# Patient Record
Sex: Female | Born: 1990
Health system: Southern US, Community
[De-identification: ages and names within clinical notes are randomized; demographics above are authoritative.]

## PROBLEM LIST (undated history)

## (undated) DIAGNOSIS — R87810 Cervical high risk human papillomavirus (HPV) DNA test positive: Secondary | ICD-10-CM

## (undated) DIAGNOSIS — B009 Herpesviral infection, unspecified: Secondary | ICD-10-CM

## (undated) DIAGNOSIS — Z23 Encounter for immunization: Secondary | ICD-10-CM

## (undated) DIAGNOSIS — R87619 Unspecified abnormal cytological findings in specimens from cervix uteri: Secondary | ICD-10-CM

## (undated) HISTORY — DX: Unspecified abnormal cytological findings in specimens from cervix uteri: R87.619

## (undated) HISTORY — DX: Encounter for immunization: Z23

## (undated) HISTORY — DX: Herpesviral infection, unspecified: B00.9

## (undated) HISTORY — DX: Cervical high risk human papillomavirus (HPV) DNA test positive: R87.810

## (undated) HISTORY — PX: WISDOM TOOTH EXTRACTION: SHX21

---
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
## 2009-02-12 HISTORY — PX: DILATION AND CURETTAGE OF UTERUS: SHX78

## 2009-08-02 ENCOUNTER — Ambulatory Visit: Payer: Self-pay

## 2009-08-04 ENCOUNTER — Ambulatory Visit: Payer: Self-pay

## 2009-09-25 ENCOUNTER — Emergency Department (HOSPITAL_COMMUNITY)
Admission: EM | Admit: 2009-09-25 | Discharge: 2009-09-25 | Payer: Self-pay | Source: Home / Self Care | Admitting: Emergency Medicine

## 2010-10-14 DIAGNOSIS — R87619 Unspecified abnormal cytological findings in specimens from cervix uteri: Secondary | ICD-10-CM

## 2010-10-14 HISTORY — DX: Unspecified abnormal cytological findings in specimens from cervix uteri: R87.619

## 2012-03-01 ENCOUNTER — Observation Stay: Payer: Self-pay

## 2012-03-01 LAB — URINALYSIS, COMPLETE
Ketone: NEGATIVE
Leukocyte Esterase: NEGATIVE
Protein: NEGATIVE
Specific Gravity: 1.002 (ref 1.003–1.030)
WBC UR: <1 /HPF (ref 0–5)

## 2012-03-01 LAB — CBC WITH DIFFERENTIAL/PLATELET
Eosinophil #: 0 10*3/uL (ref 0.0–0.7)
Eosinophil %: 0.6 %
MCV: 85 fL (ref 80–100)
Monocyte %: 6.5 %
Neutrophil #: 4.9 10*3/uL (ref 1.4–6.5)
Platelet: 215 10*3/uL (ref 150–440)
RBC: 3.5 10*6/uL — ABNORMAL LOW (ref 3.80–5.20)
RDW: 13.1 % (ref 11.5–14.5)
WBC: 6.7 10*3/uL (ref 3.6–11.0)

## 2012-03-02 LAB — URINE CULTURE

## 2012-07-03 ENCOUNTER — Observation Stay: Payer: Self-pay | Admitting: Obstetrics and Gynecology

## 2012-07-05 ENCOUNTER — Observation Stay: Payer: Self-pay

## 2012-07-07 ENCOUNTER — Inpatient Hospital Stay: Payer: Self-pay

## 2012-07-07 LAB — CBC WITH DIFFERENTIAL/PLATELET
Basophil #: 0 10*3/uL (ref 0.0–0.1)
MCH: 24 pg — ABNORMAL LOW (ref 26.0–34.0)
MCV: 72 fL — ABNORMAL LOW (ref 80–100)
Monocyte #: 0.5 x10 3/mm (ref 0.2–0.9)
Neutrophil #: 5.5 10*3/uL (ref 1.4–6.5)
RBC: 3.83 10*6/uL (ref 3.80–5.20)

## 2012-07-08 LAB — HEMATOCRIT: HCT: 19.5 % — ABNORMAL LOW (ref 35.0–47.0)

## 2012-07-09 LAB — CBC WITH DIFFERENTIAL/PLATELET
Basophil #: 0 10*3/uL (ref 0.0–0.1)
HCT: 20.1 % — ABNORMAL LOW (ref 35.0–47.0)
HGB: 6.7 g/dL — ABNORMAL LOW (ref 12.0–16.0)
Lymphocyte %: 22.5 %
MCHC: 33.4 g/dL (ref 32.0–36.0)
MCV: 72 fL — ABNORMAL LOW (ref 80–100)
Monocyte #: 0.4 x10 3/mm (ref 0.2–0.9)
Neutrophil %: 67.9 %
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
## 2013-10-07 ENCOUNTER — Ambulatory Visit: Payer: Self-pay | Admitting: General Surgery

## 2013-10-08 ENCOUNTER — Encounter: Payer: Self-pay | Admitting: *Deleted

## 2014-01-12 DIAGNOSIS — B009 Herpesviral infection, unspecified: Secondary | ICD-10-CM

## 2014-01-12 HISTORY — DX: Herpesviral infection, unspecified: B00.9

## 2014-06-22 NOTE — H&P (Signed)
L&D Evaluation:  History Expanded:   HPI 24 yo G2P0010 whose EDC = may w26 (21 weeks), [facility].  Pt presents with one day of RLQ and LMQ pain which she characterizes as a sharp. intmittent, non-radiating pain, 5-6 opn a scale of 10.  Pt denies GI or GU Sx.  Pt denies VB and LOF    Blood Type (Maternal) B negative    Maternal HIV Negative    Maternal Syphilis Ab Nonreactive    Maternal Varicella Immune    Rubella Results (Maternal) immune    [facility]    Presents with abdominal pain    Patient's Medical History No Chronic Illness    Patient's Surgical History D&C    Medications Pre Natal Vitamins    Allergies NKDA    Social History none   ROS:   ROS All systems were reviewed.  HEENT, CNS, GI, GU, Respiratory, CV, Renal and Musculoskeletal systems were found to be normal.   Exam:   Vital Signs stable    General no apparent distress    Mental Status clear    Chest clear    Heart normal sinus rhythm    Abdomen gravid, non-tender    Estimated Fetal Weight Average for gestational age    Back no CVAT    Pelvic cervix closed and thick    Mebranes Intact    FHT normal rate with no decels    Other UA clear   Impression:   Impression Abdominal pain, probably physiologic   Plan:   Plan will allow to go home, restrict her work load, RTL+D with any increase in pain, etc   Electronic Signatures: Towana Badgerosenow, Denilson Salminen J (MD)  (Signed 18-Jan-14 14:55)  Authored: L&D Evaluation   Last Updated: 18-Jan-14 14:55 by Towana Badgerosenow, Olanda Downie J (MD)

## 2014-06-22 NOTE — H&P (Signed)
L&D Evaluation:  History Expanded:  HPI 24 yo G2P0010 whose EDC 07/07/12 per early US, presents at 40 weeks with contractions. No LOF or VB. [facility]   Blood Type (Maternal) B negative   Group B Strep Results Maternal (Result >5wks must be treated as unknown) negative   Maternal HIV Negative   Maternal Syphilis Ab Nonreactive   Maternal Varicella Immune   Rubella Results (Maternal) immune   EDC 07-Jul-2012   Presents with leaking fluid   Patient's Medical History No Chronic Illness   Patient's Surgical History D&C  wisdom teeth   Medications Pre Natal Vitamins   Allergies NKDA   Social History none   ROS:  ROS All systems were reviewed.  HEENT, CNS, GI, GU, Respiratory, CV, Renal and Musculoskeletal systems were found to be normal.   Exam:  Vital Signs stable   General no apparent distress   Mental Status clear   Chest clear   Heart no murmur/gallop/rubs   Abdomen gravid   Estimated Fetal Weight Average for gestational age   Back no CVAT   Pelvic cervix closed and thick, 3 cm on admission, now 6/100/-1   Mebranes Intact   FHT normal rate with no decels   Ucx regular, q 2-5 min   Impression:  Impression active labor   Plan:  Comments Anticipate delivery Comfortable after epidural now AROM with scant bloody fluid obtained   Electronic Signatures: Vella KohlerBrothers, Emmaleah Meroney K (CNM)  (Signed 26-May-14 08:20)  Authored: L&D Evaluation   Last Updated: 26-May-14 08:20 by Vella KohlerBrothers, Lateia Fraser K (CNM)

## 2014-06-22 NOTE — H&P (Signed)
L&D Evaluation:  History Expanded:  HPI 24 yo G2P0010 whose EDC 07/07/12 per early US, presents at 6739 5/7 weeks with c/o feeling leaking and having wetness on her pantyliner this am. Also noted some bleeding this am. +FM, occasional contractions. [facility].   Blood Type (Maternal) B negative   Maternal HIV Negative   Maternal Syphilis Ab Nonreactive   Maternal Varicella Immune   Rubella Results (Maternal) immune   [facility]   Presents with leaking fluid   Patient's Medical History No Chronic Illness   Patient's Surgical History D&C   Medications Pre Natal Vitamins   Allergies NKDA   Social History none   ROS:  ROS All systems were reviewed.  HEENT, CNS, GI, GU, Respiratory, CV, Renal and Musculoskeletal systems were found to be normal.   Exam:  Vital Signs stable   General no apparent distress   Mental Status clear   Abdomen gravid, non-tender   Estimated Fetal Weight Average for gestational age   Back no CVAT   Pelvic cervix closed and thick, SSE: negative nitrizine, negative fern, some clear mucousy d/c in vagina, wet mount: +yeast   Mebranes Intact   FHT normal rate with no decels   Ucx irregular   Impression:  Impression intact membranes   Plan:  Plan discharge   Comments Labor precautions Diflucan x 1 po   Follow Up Appointment already scheduled   Electronic Signatures: Vella KohlerBrothers, Louanne Calvillo K (CNM)  (Signed 24-May-14 16:25)  Authored: L&D Evaluation   Last Updated: 24-May-14 16:25 by Vella KohlerBrothers, Nikiah Goin K (CNM)

## 2015-01-17 ENCOUNTER — Ambulatory Visit
Admission: EM | Admit: 2015-01-17 | Discharge: 2015-01-17 | Disposition: A | Discharge status: Home / Self Care | Payer: 59 | Attending: Family Medicine | Admitting: Family Medicine

## 2015-01-17 DIAGNOSIS — J0100 Acute maxillary sinusitis, unspecified: Secondary | ICD-10-CM | POA: Diagnosis not present

## 2015-01-17 DIAGNOSIS — H6502 Acute serous otitis media, left ear: Secondary | ICD-10-CM | POA: Diagnosis not present

## 2015-01-17 LAB — RAPID STREP SCREEN (MED CTR MEBANE ONLY): STREPTOCOCCUS, GROUP A SCREEN (DIRECT): NEGATIVE

## 2015-01-17 MED ORDER — FLUTICASONE PROPIONATE 50 MCG/ACT NA SUSP: 2.0000 | Freq: Every day | NASAL | Status: DC

## 2015-01-17 MED ORDER — AMOXICILLIN-POT CLAVULANATE 875-125 MG PO TABS
1.0000 | ORAL_TABLET | Freq: Two times a day (BID) | ORAL | Status: DC
Start: 2015-01-17 — End: 2016-05-23

## 2015-01-17 MED ORDER — FEXOFENADINE-PSEUDOEPHED ER 180-240 MG PO TB24: 1.0000 | ORAL_TABLET | Freq: Every day | ORAL | Status: DC

## 2015-01-17 NOTE — ED Notes (Signed)
Started last Monday with runny nose, cough and sore throat. Not improving and also states has right ear pain

## 2015-01-17 NOTE — ED Provider Notes (Addendum)
CSN: [id number]     Arrival date & time 01/17/15  1007 History   First MD Initiated Contact with Patient 01/17/15 1218    Nurses notes were reviewed. Chief Complaint  Patient presents with  . URI    Patient's here because of URI symptoms for over a week. Started last week Monday or 8 days ago. Sore throat has progressively gotten somewhat better still there but most important thing is that the nasal congestion is worse she started having pain 2 days: The right ear in the right is been painful left is full but not really a lot of pain going to her. States that she is having trouble blowing anything from her nostrils and that she is coughing more as postnasal drainage going down the back of her throat.   (Consider location/radiation/quality/duration/timing/severity/associated sxs/prior Treatment) HPI  History reviewed. No pertinent past medical history. History reviewed. No pertinent past surgical history. History reviewed. No pertinent family history. Social History  Substance Use Topics  . Smoking status: Never Smoker   . Smokeless tobacco: None  . Alcohol Use: Yes     Comment: socially   OB History    No data available     Review of Systems  HENT: Positive for congestion, ear pain and facial swelling.   Eyes: Negative for discharge.  Respiratory: Positive for cough.   Cardiovascular: Negative for chest pain.  Skin: Negative for color change, pallor and rash.  Allergic/Immunologic: Negative for environmental allergies.  All other systems reviewed and are negative.   Allergies  Review of patient's allergies indicates no known allergies.  Home Medications   Prior to Admission medications   Medication Sig Start Date End Date Taking? Authorizing Provider  amoxicillin-clavulanate (AUGMENTIN) 875-125 MG tablet Take 1 tablet by mouth 2 (two) times daily. 01/17/15   Hassan Rowan, MD  fexofenadine-pseudoephedrine (ALLEGRA-D ALLERGY & CONGESTION) 180-240 MG 24 hr tablet Take 1  tablet by mouth daily. 01/17/15   Hassan Rowan, MD  fluticasone (FLONASE) 50 MCG/ACT nasal spray Place 2 sprays into both nostrils daily. 01/17/15   Hassan Rowan, MD  levonorgestrel (MIRENA) 20 MCG/24HR IUD 1 each by Intrauterine route once.    Historical Provider, MD   Meds Ordered and Administered this Visit  Medications - No data to display  BP 93/53 mmHg  Pulse 55  Temp(Src) 98.2 F (36.8 C) (Tympanic)  Resp 16  Ht  (1.702 m)  Wt 160 lb (72.576 kg)  BMI 25.05 kg/m2  SpO2 100% No data found.   Physical Exam  Constitutional: She is oriented to person, place, and time. She appears well-developed.  HENT:  Head: Normocephalic and atraumatic.  Right Ear: External ear and ear canal normal. Tympanic membrane is bulging. Decreased hearing is noted.  Left Ear: External ear and ear canal normal. Tympanic membrane is erythematous. Decreased hearing is noted.  Mouth/Throat: No dental abscesses or dental caries. No posterior oropharyngeal edema or posterior oropharyngeal erythema.  Eyes: Pupils are equal, round, and reactive to light.  Neck: Normal range of motion. Neck supple.  Cardiovascular: Normal rate, regular rhythm and normal heart sounds.   Pulmonary/Chest: Effort normal and breath sounds normal. No respiratory distress.  Musculoskeletal: Normal range of motion. She exhibits no edema or tenderness.  Neurological: She is alert and oriented to person, place, and time. No cranial nerve deficit.  Skin: Skin is warm and dry. No erythema.  Psychiatric: She has a normal mood and affect. Her behavior is normal.  Vitals reviewed.   ED  Course  Procedures (including critical care time)  Labs Review Labs Reviewed  RAPID STREP SCREEN (NOT AT [facility])  CULTURE, GROUP A STREP ([facility] ONLY)    Imaging Review No results found.   Visual Acuity Review  Right Eye Distance:   Left Eye Distance:   Bilateral Distance:    Right Eye Near:   Left Eye Near:    Bilateral Near:        Results for orders placed or performed during the hospital encounter of 01/17/15  Rapid strep screen  Result Value Ref Range   Streptococcus, Group A Screen (Direct) NEGATIVE NEGATIVE    MDM   1. Acute maxillary sinusitis, recurrence not specified   2. Acute serous otitis media of left ear, recurrence not specified    We'll place patient on Augmentin 875 one tablet twice a day Flonase nasal spray 2 puffs each nostril daily sent to the pharmacy as well as Allegra-D 24 hours 1 tablet daily. Will give a work note for today and tomorrow follow-up PCP as needed.     Hassan RowanEugene Terie Lear, MD 01/17/15 [id number]  Hassan RowanEugene Tyon Cerasoli, MD 01/17/15 [phone]

## 2015-01-17 NOTE — Discharge Instructions (Signed)
Otitis Media, Adult °Otitis media is redness, soreness, and puffiness (swelling) in the space just behind your eardrum (middle ear). It may be caused by allergies or infection. It often happens along with a cold. °HOME CARE °· Take your medicine as told. Finish it even if you start to feel better. °· Only take over-the-counter or prescription medicines for pain, discomfort, or fever as told by your doctor. °· Follow up with your doctor as told. °GET HELP IF: °· You have otitis media only in one ear, or bleeding from your nose, or both. °· You notice a lump on your neck. °· You are not getting better in 3-5 days. °· You feel worse instead of better. °GET HELP RIGHT AWAY IF:  °· You have pain that is not helped with medicine. °· You have puffiness, redness, or pain around your ear. °· You get a stiff neck. °· You cannot move part of your face (paralysis). °· You notice that the bone behind your ear hurts when you touch it. °MAKE SURE YOU:  °· Understand these instructions. °· Will watch your condition. °· Will get help right away if you are not doing well or get worse. °  °This information is not intended to replace advice given to you by your health care provider. Make sure you discuss any questions you have with your health care provider. °  °Document Released: 07/18/2007 Document Revised: 02/19/2014 Document Reviewed: 08/26/2012 °Elsevier Interactive Patient Education ©2016 [employer]vier Inc[employer] ° °Sinusitis, Adult °Sinusitis is redness, soreness, and puffiness (inflammation) of the air pockets in the bones of your face (sinuses). The redness, soreness, and puffiness can cause air and mucus to get trapped in your sinuses. This can allow germs to grow and cause an infection.  °HOME CARE  °· Drink enough fluids to keep your pee (urine) clear or pale yellow. °· Use a humidifier in your home. °· Run a hot shower to create steam in the bathroom. Sit in the bathroom with the door closed. Breathe in the steam 3-4 times a  day. °· Put a warm, moist washcloth on your face 3-4 times a day, or as told by your doctor. °· Use salt water sprays (saline sprays) to wet the thick fluid in your nose. This can help the sinuses drain. °· Only take medicine as told by your doctor. °GET HELP RIGHT AWAY IF:  °· Your pain gets worse. °· You have very bad headaches. °· You are sick to your stomach (nauseous). °· You throw up (vomit). °· You are very sleepy (drowsy) all the time. °· Your face is puffy (swollen). °· Your vision changes. °· You have a stiff neck. °· You have trouble breathing. °MAKE SURE YOU:  °· Understand these instructions. °· Will watch your condition. °· Will get help right away if you are not doing well or get worse. °  °This information is not intended to replace advice given to you by your health care provider. Make sure you discuss any questions you have with your health care provider. °  °Document Released: 07/18/2007 Document Revised: 02/19/2014 Document Reviewed: 09/04/2011 °Elsevier Interactive Patient Education ©2016 Elsevier Inc. ° °

## 2015-01-20 LAB — CULTURE, GROUP A STREP (THRC)

## 2015-01-20 NOTE — ED Notes (Signed)
Final report of strep testing negative  

## 2016-04-02 DIAGNOSIS — Z113 Encounter for screening for infections with a predominantly sexual mode of transmission: Secondary | ICD-10-CM | POA: Diagnosis not present

## 2016-04-02 DIAGNOSIS — N760 Acute vaginitis: Secondary | ICD-10-CM | POA: Diagnosis not present

## 2016-04-02 DIAGNOSIS — A5901 Trichomonal vulvovaginitis: Secondary | ICD-10-CM | POA: Diagnosis not present

## 2016-05-07 ENCOUNTER — Other Ambulatory Visit: Payer: Self-pay | Admitting: Obstetrics and Gynecology

## 2016-05-10 ENCOUNTER — Telehealth: Payer: Self-pay

## 2016-05-10 NOTE — Telephone Encounter (Signed)
Pt calling for refill of metronidazole to [facility] [address]

## 2016-05-10 NOTE — Telephone Encounter (Signed)
RN to find out why she needs flagyl. Treated for trich 2/18. If sx again, needs rechk, not Rx RF, and did partner do testing? Thx.

## 2016-05-23 ENCOUNTER — Encounter: Payer: Self-pay | Admitting: Obstetrics and Gynecology

## 2016-05-23 ENCOUNTER — Ambulatory Visit (INDEPENDENT_AMBULATORY_CARE_PROVIDER_SITE_OTHER): Payer: BLUE CROSS/BLUE SHIELD | Admitting: Obstetrics and Gynecology

## 2016-05-23 VITALS — BP 110/70 | HR 54 | Ht 67.0 in | Wt 160.0 lb

## 2016-05-23 DIAGNOSIS — A599 Trichomoniasis, unspecified: Secondary | ICD-10-CM

## 2016-05-23 DIAGNOSIS — N898 Other specified noninflammatory disorders of vagina: Secondary | ICD-10-CM | POA: Diagnosis not present

## 2016-05-23 LAB — POCT WET PREP WITH KOH
Clue Cells Wet Prep HPF POC: NEGATIVE
KOH PREP POC: POSITIVE — AB
TRICHOMONAS UA: POSITIVE
YEAST WET PREP PER HPF POC: NEGATIVE

## 2016-05-23 MED ORDER — METRONIDAZOLE 500 MG PO TABS
500.0000 mg | ORAL_TABLET | Freq: Two times a day (BID) | ORAL | 0 refills | Status: AC
Start: 2016-05-23 — End: 2016-05-24

## 2016-05-23 NOTE — Progress Notes (Signed)
Chief Complaint  Patient presents with  . Vaginal Discharge     HPI:      Pamela Bishop is a 26 y.o. G2P1011 who LMP was No LMP recorded (lmp unknown). Patient is not currently having periods (Reason: IUD)., presents today for a problem visit.  Sex activity: single partner, contraception - IUD.   She complains of increased vaginal d/c, without irritation or odor for the past 2-3 wks. She was treated for trich 2/18. She states her partner was treated, too. She had neg gon/chlam testing 2/18. No fevers, LBP, belly pain.   Review of Systems  Constitutional: Negative for fever.  Gastrointestinal: Negative for blood in stool, constipation, diarrhea, nausea and vomiting.  Genitourinary: Positive for vaginal discharge. Negative for dyspareunia, dysuria, flank pain, frequency, hematuria, urgency, vaginal bleeding and vaginal pain.  Musculoskeletal: Negative for back pain.  Skin: Negative for rash.    Past Medical History:  Diagnosis Date  . Abnormal Pap smear of cervix 10/2010   lsil  . Herpes 01/2014   outbreak on cx; Pos HSV 2 IgG  . Immunization, viral disease    gardasil completed    History reviewed. No pertinent family history.  Social History   Social History  . Marital status: Single    Spouse name: N/A  . Number of children: N/A  . Years of education: N/A   Occupational History  . Not on file.   Social History Main Topics  . Smoking status: Never Smoker  . Smokeless tobacco: Never Used  . Alcohol use Yes     Comment: socially  . Drug use: No  . Sexual activity: Yes    Birth control/ protection: IUD   Other Topics Concern  . Not on file   Social History Narrative  . No narrative on file    OBJECTIVE:   Vitals:  BP 110/70 (Patient Position: Sitting)   Pulse (!) 54   Ht  (1.702 m)   Wt 160 lb (72.6 kg)   LMP  (LMP Unknown)   BMI 25.06 kg/m   Physical Exam  Constitutional: She is oriented to person, place, and time and well-developed,  well-nourished, and in no distress. Vital signs are normal.  Genitourinary: Uterus normal, cervix normal, right adnexa normal, left adnexa normal and vulva normal. Uterus is not enlarged. Cervix exhibits no motion tenderness and no tenderness. Right adnexum displays no mass and no tenderness. Left adnexum displays no mass and no tenderness. Vulva exhibits no erythema, no lesion, no rash and no tenderness. Vagina exhibits exudate. Vagina exhibits no lesion. Creamy  white and vaginal discharge found.  Neurological: She is oriented to person, place, and time.    Results: Results for orders placed or performed in visit on 05/23/16 (from the past 24 hour(s))  POCT Wet Prep with KOH     Status: Abnormal   Collection Time: 05/23/16  8:47 AM  Result Value Ref Range   Trichomonas, UA Positive    Clue Cells Wet Prep HPF POC neg    Epithelial Wet Prep HPF POC  Few, Moderate, Many, Too numerous to count   Yeast Wet Prep HPF POC neg    Bacteria Wet Prep HPF POC  Few   RBC Wet Prep HPF POC     WBC Wet Prep HPF POC     KOH Prep POC Positive (A) Negative     Assessment/Plan: Trichomonosis - Rx flagyl. Partner needs tx. Condoms. F/u prn. - Plan: metroNIDAZOLE (FLAGYL) 500 MG tablet,  POCT Wet Prep with KOH  Vaginal discharge - Plan: POCT Wet Prep with KOH     Return if symptoms worsen or fail to improve.  Alicia B. Copland, PA-C 05/23/2016 8:50 AM

## 2016-12-18 ENCOUNTER — Encounter: Payer: Self-pay | Admitting: Maternal Newborn

## 2016-12-18 ENCOUNTER — Ambulatory Visit (INDEPENDENT_AMBULATORY_CARE_PROVIDER_SITE_OTHER): Payer: BLUE CROSS/BLUE SHIELD | Admitting: Maternal Newborn

## 2016-12-18 VITALS — BP 80/60 | HR 54 | Ht 67.0 in | Wt 169.0 lb

## 2016-12-18 DIAGNOSIS — R319 Hematuria, unspecified: Secondary | ICD-10-CM

## 2016-12-18 DIAGNOSIS — R102 Pelvic and perineal pain: Secondary | ICD-10-CM | POA: Diagnosis not present

## 2016-12-18 DIAGNOSIS — R1030 Lower abdominal pain, unspecified: Secondary | ICD-10-CM | POA: Diagnosis not present

## 2016-12-18 DIAGNOSIS — B3731 Acute candidiasis of vulva and vagina: Secondary | ICD-10-CM

## 2016-12-18 DIAGNOSIS — B373 Candidiasis of vulva and vagina: Secondary | ICD-10-CM | POA: Diagnosis not present

## 2016-12-18 LAB — POCT WET PREP (WET MOUNT)
Clue Cells Wet Prep Whiff POC: NEGATIVE
TRICHOMONAS WET PREP HPF POC: ABSENT

## 2016-12-18 LAB — POCT URINALYSIS DIPSTICK
LEUKOCYTES UA: NEGATIVE
NITRITE UA: ABSENT

## 2016-12-18 LAB — URINALYSIS
Spec Grav, Fluid: 1.01
Urine, pH: 6

## 2016-12-18 MED ORDER — FLUCONAZOLE 150 MG PO TABS: 150.0000 mg | ORAL_TABLET | ORAL | 0 refills | Status: DC

## 2016-12-18 NOTE — Progress Notes (Signed)
Gynecology Pelvic Pain Evaluation   Chief Complaint:  Chief Complaint  Patient presents with  . Pelvic Pain  . Back Pain    History of Present Illness:   Patient is a 26 y.o. G2P101461,  No LMP recorded (lmp unknown). Patient is not currently having periods (Reason: IUD)., presents today for a problem visit.  She complains of discharge and pain.   Her pain is localized to the deep pelvis area, described as intermittent, began several weeks ago and its severity is described as moderate. The pain radiates to the  bilateral back. She has these associated symptoms which include vaginal discharge and occasional feeling of pelvic pressure. Patient has these modifiers which include nothing that makes it better and movement/standing that make it worse.  Context includes: spontaneous and at rest.  Patient notes some lower abdominal pain and no change in bowel habits.  Previous evaluation: none. Prior Diagnosis: none. Previous Treatment: none.  Review of Systems: Review of Systems  Constitutional: Negative.   HENT: Negative.   Eyes: Negative.   Respiratory: Negative.   Cardiovascular: Negative.   Gastrointestinal: Negative.   Genitourinary: Negative for dysuria, frequency and urgency.       Pelvic pain and discharge  Musculoskeletal: Positive for back pain.  Skin: Negative.   Neurological: Negative.   Endo/Heme/Allergies: Negative.   Psychiatric/Behavioral: Negative.   All other systems reviewed and are negative.   Past Medical History:  Past Medical History:  Diagnosis Date  . Abnormal Pap smear of cervix 10/2010   lsil  . Herpes 01/2014   outbreak on cx; Pos HSV 2 IgG  . Immunization, viral disease    gardasil completed    Past Surgical History:  Past Surgical History:  Procedure Laterality Date  . DILATION AND CURETTAGE OF UTERUS  2011   [facility]  . WISDOM TOOTH EXTRACTION      Gynecologic History:  No LMP recorded (lmp unknown). Patient is not currently having periods  (Reason: IUD). She is single partner, contraception - IUD.  Hx of STDs: trichomonas, HPV positive, herpes  Obstetric History: G2P1011  Family History:  History reviewed. No pertinent family history.  Social History:  Social History   Socioeconomic History  . Marital status: Single    Spouse name: Not on file  . Number of children: Not on file  . Years of education: Not on file  . Highest education level: Not on file  Social Needs  . Financial resource strain: Not on file  . Food insecurity - worry: Not on file  . Food insecurity - inability: Not on file  . Transportation needs - medical: Not on file  . Transportation needs - non-medical: Not on file  Occupational History  . Not on file  Tobacco Use  . Smoking status: Never Smoker  . Smokeless tobacco: Never Used  Substance and Sexual Activity  . Alcohol use: Yes    Comment: socially  . Drug use: No  . Sexual activity: Yes    Birth control/protection: IUD  Other Topics Concern  . Not on file  Social History Narrative  . Not on file    Allergies:  No Known Allergies  Medications: Prior to Admission medications   Medication Sig Start Date End Date Taking? Authorizing Provider  fluconazole (DIFLUCAN) 150 MG tablet Take 1 tablet (150 mg total) as directed for 1 dose by mouth. Can take additional dose three days later if symptoms persist 12/18/16   Oswaldo ConroySchmid, Brittley Regner Y, CNM  levonorgestrel (MIRENA) 20 MCG/24HR IUD  1 each by Intrauterine route once.    [provider]  valACYclovir (VALTREX) 500 MG tablet Take 500 mg by mouth 2 (two) times daily as needed. 03/21/16   [provider]    Physical Exam Vitals: Blood pressure (!) 80/60, pulse (!) 54, height 5\' 7"  (1.702 m), weight 169 lb (76.7 kg).  General: NAD HEENT: normocephalic, anicteric Pulmonary: No increased work of breathing Abdomen: NABS, soft, non-tender, non-distended.  Umbilicus without lesions.  No hepatomegaly, splenomegaly or masses  palpable. No evidence of hernia  Genitourinary:  External: External female genitalia appears irritated.  Normal urethral meatus, normal Bartholin's  and Skene's glands.    Vagina: Normal vaginal mucosa, no evidence of prolapse.    Cervix:  Does not appear reddened or friable, no bleeding, small amount of white discharge present.  IUD strings visible, small whitish appearing object or tissue visible at external os, did not appear to  be part of IUD and unable to remove with swab/gentle manipulation.   Uterus: Non-enlarged, mobile, normal contour.  No CMT. Tenderness to palpation in midline.  Adnexa: ovaries non-enlarged, no adnexal masses  Rectal: deferred  Lymphatic: no evidence of inguinal lymphadenopathy Extremities: no edema, erythema, or tenderness Neurologic: Grossly intact Psychiatric: mood appropriate, affect full  Hematuria found on UA today. Wet mount positive for yeast.  Assessment: 26 y.o. G2P1011 with discharge and pelvic pain radiating to her lower back on both sides..  Problem List Items Addressed This Visit    None    Visit Diagnoses    Pelvic pain    -  Primary   Relevant Orders   NuSwab Vaginitis Plus (VG+)   US PELVIS TRANSVANGINAL NON-OB (TV ONLY)   Lower abdominal pain       Relevant Orders   POCT urinalysis dipstick   Hematuria, unspecified type       Relevant Orders   Urine Culture   Vaginal yeast infection       Relevant Medications   fluconazole (DIFLUCAN) 150 MG tablet       We discussed some of the possible etiologies for pelvic pain in women.  Gynecologic causes may include endometriosis, adenomyosis, pelvic inflammatory disease (PID), ovarian cysts, ovarian or tubal torsion, and in rare case gynecologic malignancy such as cervical, uterine, or ovarian cancer.  In addition, there is a possibility of non-gynecologic etiologies such as urinary or GI tract pathology or disorders, as well as musculoskeletal problems.  The goal is to complete a basic work up  in hopes of identifying the underlying cause which in turn will dictate treatment.   - Transvaginal ultrasound ordered, patient prefers to defer to same date as annual exam scheduled on 01/01/2017. - NuSwab VG Plus to rule out STDs and yeast other than C. albicans. - UA ordered and reviwed, urine culture sent due to RBCs on UA and lumbar pain.  Follow up on 01/01/17 for ultrasound and annual, or sooner if symptoms worsen.  Marcelyn BruinsJacelyn Sherell Christoffel, CNM 12/18/2016  10:08 PM

## 2016-12-20 LAB — URINE CULTURE

## 2016-12-21 LAB — NUSWAB VAGINITIS PLUS (VG+)
ATOPOBIUM VAGINAE: HIGH {score} — AB
CANDIDA GLABRATA, NAA: NEGATIVE
Candida albicans, NAA: NEGATIVE
Chlamydia trachomatis, NAA: NEGATIVE
Megasphaera 1: HIGH Score — AB
NEISSERIA GONORRHOEAE, NAA: NEGATIVE
Trich vag by NAA: NEGATIVE

## 2016-12-24 ENCOUNTER — Other Ambulatory Visit: Payer: Self-pay | Admitting: Maternal Newborn

## 2016-12-24 DIAGNOSIS — B9689 Other specified bacterial agents as the cause of diseases classified elsewhere: Secondary | ICD-10-CM

## 2016-12-24 DIAGNOSIS — N760 Acute vaginitis: Principal | ICD-10-CM

## 2016-12-24 MED ORDER — METRONIDAZOLE 500 MG PO TABS: 500.0000 mg | ORAL_TABLET | Freq: Two times a day (BID) | ORAL | 0 refills | Status: AC

## 2016-12-24 NOTE — Progress Notes (Signed)
Rx for Flagyl sent to pharmacy for lab results indicating presence of bacterial vaginosis. Spoke with patient and she is aware and knows to avoid alcohol during tx.  Marcelyn BruinsJacelyn Tasha Jindra, CNM 12/24/2016  11:34 AM

## 2017-01-01 ENCOUNTER — Encounter: Payer: Self-pay | Admitting: Obstetrics and Gynecology

## 2017-01-01 ENCOUNTER — Ambulatory Visit (INDEPENDENT_AMBULATORY_CARE_PROVIDER_SITE_OTHER): Payer: BLUE CROSS/BLUE SHIELD | Admitting: Obstetrics and Gynecology

## 2017-01-01 ENCOUNTER — Ambulatory Visit (INDEPENDENT_AMBULATORY_CARE_PROVIDER_SITE_OTHER): Payer: BLUE CROSS/BLUE SHIELD

## 2017-01-01 VITALS — BP 100/60 | HR 48 | Ht 67.0 in | Wt 160.0 lb

## 2017-01-01 DIAGNOSIS — Z30431 Encounter for routine checking of intrauterine contraceptive device: Secondary | ICD-10-CM

## 2017-01-01 DIAGNOSIS — R1031 Right lower quadrant pain: Secondary | ICD-10-CM

## 2017-01-01 DIAGNOSIS — R109 Unspecified abdominal pain: Secondary | ICD-10-CM | POA: Diagnosis not present

## 2017-01-01 DIAGNOSIS — Z124 Encounter for screening for malignant neoplasm of cervix: Secondary | ICD-10-CM | POA: Diagnosis not present

## 2017-01-01 DIAGNOSIS — A6004 Herpesviral vulvovaginitis: Secondary | ICD-10-CM | POA: Diagnosis not present

## 2017-01-01 DIAGNOSIS — Z01419 Encounter for gynecological examination (general) (routine) without abnormal findings: Secondary | ICD-10-CM | POA: Diagnosis not present

## 2017-01-01 DIAGNOSIS — R102 Pelvic and perineal pain: Secondary | ICD-10-CM | POA: Diagnosis not present

## 2017-01-01 DIAGNOSIS — R87610 Atypical squamous cells of undetermined significance on cytologic smear of cervix (ASC-US): Secondary | ICD-10-CM | POA: Diagnosis not present

## 2017-01-01 NOTE — Progress Notes (Signed)
PCP:  Patient, No Pcp Per   Chief Complaint  Patient presents with  . Gynecologic Exam     HPI:      Ms. Pamela Bishop is a 26 y.o. G2P1011 who LMP was No LMP recorded (lmp unknown). Patient is not currently having periods (Reason: IUD)., presents today for her annual examination.  Her menses are absent due to the IUD. Dysmenorrhea mild, occurring throughout cycle. She does have occas intermenstrual bleeding.  Sex activity: single partner, contraception - IUD. Mirena placed 08/20/12. [facility]  Last Pap: July 25, 2015  Results were: no abnormalities /neg HPV DNA  Hx of STDs: HSV, HPV. No recent HSV outbreaks. Doesn't need Rx valtrex RF currently.   There is no FH of breast cancer. There is no FH of ovarian cancer. The patient does do self-breast exams.  Tobacco use: The patient denies current or previous tobacco use. Alcohol use: none No drug use.  Exercise: mod active  She does get adequate calcium and Vitamin D in her diet.  Pt's vaginitis sx from 12/18/16 have improved with tx.   Pt also with complaints of RT flank pain that radiates to RLQ over the past month. Pain is random and intermittent, and has caused RLQ to tighten. Sx last about a minute and then resolve. No urin sx, hx of kidney stones, hematuria. Pt does have some episodic constipation. Maryruth EveJaci Schmid ordered u/s when she saw her for sx 12/18/16 and it was done today.   Past Medical History:  Diagnosis Date  . Abnormal Pap smear of cervix 10/2010   lsil  . Herpes 01/2014   outbreak on cx; Pos HSV 2 IgG  . Immunization, viral disease    gardasil completed    Past Surgical History:  Procedure Laterality Date  . DILATION AND CURETTAGE OF UTERUS  2011   [facility]  . WISDOM TOOTH EXTRACTION      History reviewed. No pertinent family history.  Social History   Socioeconomic History  . Marital status: Single    Spouse name: Not on file  . Number of children: Not on file  . Years of  education: Not on file  . Highest education level: Not on file  Social Needs  . Financial resource strain: Not on file  . Food insecurity - worry: Not on file  . Food insecurity - inability: Not on file  . Transportation needs - medical: Not on file  . Transportation needs - non-medical: Not on file  Occupational History  . Not on file  Tobacco Use  . Smoking status: Never Smoker  . Smokeless tobacco: Never Used  Substance and Sexual Activity  . Alcohol use: Yes    Comment: socially  . Drug use: No  . Sexual activity: Yes    Birth control/protection: IUD  Other Topics Concern  . Not on file  Social History Narrative  . Not on file    Current Meds  Medication Sig  . levonorgestrel (MIRENA) 20 MCG/24HR IUD 1 each by Intrauterine route once.  . valACYclovir (VALTREX) 500 MG tablet Take 500 mg by mouth 2 (two) times daily as needed.     ROS:  Review of Systems  Constitutional: Negative for fatigue, fever and unexpected weight change.  Respiratory: Negative for cough, shortness of breath and wheezing.   Cardiovascular: Negative for chest pain, palpitations and leg swelling.  Gastrointestinal: Positive for constipation. Negative for blood in stool, diarrhea, nausea and vomiting.  Endocrine: Negative  for cold intolerance, heat intolerance and polyuria.  Genitourinary: Positive for flank pain, pelvic pain and vaginal discharge. Negative for dyspareunia, dysuria, frequency, genital sores, hematuria, menstrual problem, urgency, vaginal bleeding and vaginal pain.  Musculoskeletal: Negative for back pain, joint swelling and myalgias.  Skin: Negative for rash.  Neurological: Positive for headaches. Negative for dizziness, syncope, light-headedness and numbness.  Hematological: Negative for adenopathy.  Psychiatric/Behavioral: Negative for agitation, confusion, sleep disturbance and suicidal ideas. The patient is not nervous/anxious.      Objective: BP 100/60   Pulse (!) 48   Ht  5\' 7"  (1.702 m)   Wt 160 lb (72.6 kg)   LMP  (LMP Unknown) Comment: iud  BMI 25.06 kg/m    Physical Exam  Constitutional: She is oriented to person, place, and time. She appears well-developed and well-nourished.  Genitourinary: Vagina normal. There is no rash or tenderness on the right labia. There is no rash or tenderness on the left labia. No erythema or tenderness in the vagina. No vaginal discharge found. Right adnexum does not display mass and does not display tenderness. Left adnexum does not display mass and does not display tenderness.  Cervix exhibits visible IUD strings. Cervix does not exhibit motion tenderness or polyp.   Uterus is tender. Uterus is not enlarged.  Neck: Normal range of motion. No thyromegaly present.  Cardiovascular: Normal rate, regular rhythm and normal heart sounds.  No murmur heard. Pulmonary/Chest: Effort normal and breath sounds normal. Right breast exhibits no mass, no nipple discharge, no skin change and no tenderness. Left breast exhibits no mass, no nipple discharge, no skin change and no tenderness.  Abdominal: Soft. There is no tenderness. There is no guarding.  Musculoskeletal: Normal range of motion.  Neurological: She is alert and oriented to person, place, and time. No cranial nerve deficit.  Psychiatric: She has a normal mood and affect. Her behavior is normal.  Vitals reviewed.   Results: GYN U/S-->   Assessment/Plan: Encounter for annual routine gynecological examination  Cervical cancer screening - Plan: IGP, rfx Aptima HPV ASCU  Encounter for routine checking of intrauterine contraceptive device (IUD) - IUD in place. Due for removal 7/19. Pt will [facility] next spring [facility].  Right flank pain  RLQ abdominal pain - Neg GYN u/s. IUD in place. Question related to constipation vs kidney stones. Pt to follow expectantly.   Herpes simplex vulvovaginitis - Will call for valtrex RF prn.  GYN counsel adequate intake of  calcium and vitamin D, diet and exercise     F/U  Return in about 1 year (around 01/01/2018).  Riyanshi Wahab B. Terral Cooks, PA-C 01/01/2017 11:35 AM

## 2017-01-01 NOTE — Patient Instructions (Signed)
I value your feedback and entrusting us with your care. If you get a [facility] patient survey, I would appreciate you taking the time to let us know about your experience today. Thank you! 

## 2017-01-06 LAB — IGP, RFX APTIMA HPV ASCU: PAP Smear Comment: 0

## 2017-01-06 LAB — HPV APTIMA: HPV Aptima: POSITIVE — AB

## 2017-01-07 ENCOUNTER — Telehealth: Payer: Self-pay | Admitting: Obstetrics and Gynecology

## 2017-01-07 ENCOUNTER — Encounter: Payer: Self-pay | Admitting: Obstetrics and Gynecology

## 2017-01-07 NOTE — Telephone Encounter (Signed)
Pt is calling due to missed call please advise °

## 2017-01-07 NOTE — Telephone Encounter (Signed)
Schedule 01/18/17 with Dr. Jerene PitchSchuman

## 2017-01-07 NOTE — Telephone Encounter (Signed)
Pls call pt back to sched colpo with any MD, not urgent. She is aware.

## 2017-01-18 ENCOUNTER — Encounter: Payer: Self-pay | Admitting: Obstetrics and Gynecology

## 2017-01-18 ENCOUNTER — Ambulatory Visit (INDEPENDENT_AMBULATORY_CARE_PROVIDER_SITE_OTHER): Payer: BLUE CROSS/BLUE SHIELD | Admitting: Obstetrics and Gynecology

## 2017-01-18 VITALS — BP 100/60 | HR 53 | Ht 67.0 in | Wt 165.0 lb

## 2017-01-18 DIAGNOSIS — R87810 Cervical high risk human papillomavirus (HPV) DNA test positive: Secondary | ICD-10-CM | POA: Diagnosis not present

## 2017-01-18 DIAGNOSIS — R87610 Atypical squamous cells of undetermined significance on cytologic smear of cervix (ASC-US): Secondary | ICD-10-CM

## 2017-01-24 ENCOUNTER — Ambulatory Visit: Payer: BLUE CROSS/BLUE SHIELD | Admitting: Obstetrics and Gynecology

## 2017-02-07 DIAGNOSIS — L2089 Other atopic dermatitis: Secondary | ICD-10-CM | POA: Diagnosis not present

## 2017-02-12 HISTORY — PX: IUD REMOVAL: SHX5392

## 2017-02-13 ENCOUNTER — Ambulatory Visit: Payer: BLUE CROSS/BLUE SHIELD | Admitting: Obstetrics and Gynecology

## 2017-02-27 ENCOUNTER — Ambulatory Visit: Payer: BLUE CROSS/BLUE SHIELD | Admitting: Obstetrics and Gynecology

## 2017-03-04 ENCOUNTER — Encounter: Payer: Self-pay | Admitting: Obstetrics and Gynecology

## 2017-03-04 ENCOUNTER — Ambulatory Visit (INDEPENDENT_AMBULATORY_CARE_PROVIDER_SITE_OTHER): Payer: BLUE CROSS/BLUE SHIELD | Admitting: Obstetrics and Gynecology

## 2017-03-04 ENCOUNTER — Other Ambulatory Visit: Payer: Self-pay | Admitting: Obstetrics and Gynecology

## 2017-03-04 ENCOUNTER — Telehealth: Payer: Self-pay | Admitting: Obstetrics and Gynecology

## 2017-03-04 VITALS — BP 104/60 | HR 62 | Ht 67.0 in | Wt 162.0 lb

## 2017-03-04 DIAGNOSIS — Z30011 Encounter for initial prescription of contraceptive pills: Secondary | ICD-10-CM

## 2017-03-04 DIAGNOSIS — N870 Mild cervical dysplasia: Secondary | ICD-10-CM | POA: Diagnosis not present

## 2017-03-04 DIAGNOSIS — N871 Moderate cervical dysplasia: Secondary | ICD-10-CM | POA: Diagnosis not present

## 2017-03-04 DIAGNOSIS — R87610 Atypical squamous cells of undetermined significance on cytologic smear of cervix (ASC-US): Secondary | ICD-10-CM | POA: Diagnosis not present

## 2017-03-04 DIAGNOSIS — Z30432 Encounter for removal of intrauterine contraceptive device: Secondary | ICD-10-CM

## 2017-03-04 DIAGNOSIS — R87810 Cervical high risk human papillomavirus (HPV) DNA test positive: Secondary | ICD-10-CM

## 2017-03-04 DIAGNOSIS — B977 Papillomavirus as the cause of diseases classified elsewhere: Secondary | ICD-10-CM | POA: Diagnosis not present

## 2017-03-04 DIAGNOSIS — N72 Inflammatory disease of cervix uteri: Secondary | ICD-10-CM | POA: Diagnosis not present

## 2017-03-04 MED ORDER — DESOGESTREL-ETHINYL ESTRADIOL 0.15-30 MG-MCG PO TABS: 1.0000 | ORAL_TABLET | Freq: Every day | ORAL | 11 refills | Status: DC

## 2017-03-04 NOTE — Telephone Encounter (Signed)
Pt is calling to [facility] [address] [place], [place].please advise

## 2017-03-04 NOTE — Telephone Encounter (Signed)
Okay, resent prescription.

## 2017-03-04 NOTE — Progress Notes (Signed)
   History of Present Illness:  Pamela Bishop is a 27 y.o. that had a Mirena IUD placed approximately 5 years ago. Since that time, she states that she has had problems with recurrent yeast infections.  27 y.o. G2P1011 here for colposcopy for ASCUS with POSITIVE high risk HPV pap smear on 01/01/2017 . Discussed underlying role for HPV infection in the development of cervical dysplasia, its natural history and progression/regression, need for surveillance.  Is the patient  pregnant: Yes LMP: No LMP recorded (approximate). Patient is not currently having periods (Reason: IUD). Smoking status: Never smoker Number of lifetime sexual partners:  Less than 10 Future fertility desired:  Yes  Patient given informed consent, signed copy in the chart, time out was performed.  The patient was position in dorsal lithotomy position. Speculum was placed the cervix was visualized.   After application of acetic acid colposcopic inspection of the cervix was undertaken.   Patient Active Problem List   Diagnosis Date Noted  . Right flank pain 01/01/2017   Medications:  Current Outpatient Medications on File Prior to Visit  Medication Sig Dispense Refill  . fluconazole (DIFLUCAN) 150 MG tablet Take 1 tablet (150 mg total) as directed for 1 dose by mouth. Can take additional dose three days later if symptoms persist 2 tablet 0  . levonorgestrel (MIRENA) 20 MCG/24HR IUD 1 each by Intrauterine route once.    . valACYclovir (VALTREX) 500 MG tablet Take 500 mg by mouth 2 (two) times daily as needed.  1   No current facility-administered medications on file prior to visit.    Allergies: has No Known Allergies.  Physical Exam:  BP 104/60 (BP Location: Left Arm, Patient Position: Sitting, Cuff Size: Normal)   Pulse 62   Ht 5\' 7"  (1.702 m)   Wt 162 lb (73.5 kg)   LMP  (Approximate)   BMI 25.37 kg/m  Body mass index is 25.37 kg/m. Constitutional: Well nourished, well developed female in no acute distress.    Abdomen: diffusely non tender to palpation, non distended, and no masses, hernias Neuro: Grossly intact Psych:  Normal mood and affect.   Pelvic exam:  Two IUD strings present seen coming from the cervical os. EGBUS, vaginal vault and cervix: within normal limits  GYNECOLOGY CLINIC COLPOSCOPY PROCEDURE NOTE AND IUD REMOVAL   Strings of IUD identified and grasped.  IUD removed without problem.  Pt tolerated this well.  IUD noted to be intact.  Colposcopy adequate, full visualization of transformation zone: Yes Acetowhite lesion(s) noted at 7 o'clock and mosaicism noted at 5 o'clock; corresponding biopsies obtained.   ECC specimen obtained:  Yes  All specimens were labeled and sent to pathology.   PLAN: Patient was given post procedure instructions.  Will follow up pathology and manage accordingly.  Routine preventative health maintenance measures emphasized. Prescription sent to pharmacy for oral contraceptive pills. Discussed using back up contraception such as condoms or abstinence for 1 week to avoid pregnancy.  Pamela Idlerhristanna Schuman MD [facility], [facility] 03/04/2017 9:46 AM

## 2017-03-04 NOTE — Telephone Encounter (Signed)
[facility] [place] [address]

## 2017-03-06 LAB — PATHOLOGY

## 2017-03-07 ENCOUNTER — Telehealth: Payer: Self-pay | Admitting: Obstetrics and Gynecology

## 2017-03-07 NOTE — Progress Notes (Signed)
Surgery Booking Request Patient Full Name:  Pamela Bishop  MRN: [id number]  DOB: May 26, 1990  Surgeon: Natale Milchhristanna R Micaylah Bertucci, MD  Requested Surgery Date and Time: ASAP Primary Diagnosis AND Code: CIN 2 Secondary Diagnosis and Code:  Surgical Procedure: LEEP L&D Notification: No Admission Status: office Length of Surgery: 40 min Special Case Needs: none H&P:  Phone Interview???: NA Interpreter: Language:  Medical Clearance: no Special Scheduling Instructions: please call to schedule patient

## 2017-03-07 NOTE — Telephone Encounter (Signed)
Patient is aware of LEEP on 03/15/17 @ 8:30am w/ Dr. Jerene PitchSchuman. Furniture conservator/restorernsurance info and costs discussed. Patient was also offered Monday, 03/11/17 @ 3:30pm but Friday worked better for her.

## 2017-03-07 NOTE — Telephone Encounter (Signed)
Discussed with patient, thank you!

## 2017-03-07 NOTE — Telephone Encounter (Signed)
-----   Message from Natale Milchhristanna R Schuman, MD sent at 03/07/2017 10:34 AM EST ----- Surgery Booking Request Patient Full Name:  Pamela Bishop  MRN: [id number]  DOB: [birth year]  Surgeon: Natale Milchhristanna R Schuman, MD  Requested Surgery Date and Time: ASAP Primary Diagnosis AND Code: CIN 2 Secondary Diagnosis and Code:  Surgical Procedure: LEEP L&D Notification: No Admission Status: office Length of Surgery: 40 min Special Case Needs: none H&P:  Phone Interview???: NA Interpreter: Language:  Medical Clearance: no Special Scheduling Instructions: please call to schedule patient

## 2017-03-07 NOTE — Progress Notes (Signed)
Needs LEEP for CIN 2- Called and left message to check mychart & that she will need follow up care

## 2017-03-07 NOTE — Telephone Encounter (Signed)
Pt is returning missed call From Dr. Jerene PitchSchuman about results . Please advise

## 2017-03-15 ENCOUNTER — Encounter: Payer: Self-pay | Admitting: Obstetrics and Gynecology

## 2017-03-15 ENCOUNTER — Ambulatory Visit (INDEPENDENT_AMBULATORY_CARE_PROVIDER_SITE_OTHER): Payer: BLUE CROSS/BLUE SHIELD | Admitting: Obstetrics and Gynecology

## 2017-03-15 VITALS — BP 112/66 | HR 61 | Ht 67.0 in | Wt 164.0 lb

## 2017-03-15 DIAGNOSIS — N871 Moderate cervical dysplasia: Secondary | ICD-10-CM | POA: Insufficient documentation

## 2017-03-15 DIAGNOSIS — N72 Inflammatory disease of cervix uteri: Secondary | ICD-10-CM | POA: Diagnosis not present

## 2017-03-15 DIAGNOSIS — N870 Mild cervical dysplasia: Secondary | ICD-10-CM | POA: Diagnosis not present

## 2017-03-15 NOTE — Patient Instructions (Signed)

## 2017-03-15 NOTE — Addendum Note (Signed)
Addended by: Adelene IdlerSCHUMAN, CHRISTANNA on: 03/15/2017 02:55 PM   Modules accepted: Orders

## 2017-03-15 NOTE — Progress Notes (Signed)
  LEEP PROCEDURE NOTE  The LEEP has been explained to the patient in detail; risks/benefits reviewed.  The risks include, but are not limited to, bleeding, infection, and the possibility of cervical stenosis or cervical incompetence.  The patient had previously been given information regarding abnormal PAP smears and their relationship to HPV.  We have discussed the natural course and history of HPV, the possibility of incomplete treatment by the LEEP, as well as the possibility of recurrence.  I have reviewed the consent form for LEEP with her, and she fully understands its contents.  We have discussed the procedure itself. I have informed her that following the LEEP she should refrain from intercourse and the use of tampons for three weeks, and that she should also expect some spotting and brown/black discharge over the next several days.  We have discussed the fact that vaginal bleeding, differentiated from spotting, is not normal and that if she should have this complication, she should contact me immediately.  The follow-up after LEEP will be PAP smears or viral typing performed in 1 year.  Should these all prove to be normal, she will then be back on typical cervical screening.  I have answered all of her questions, and I believe she has an adequate understanding of the LEEP, its implications, and the necessity of follow-up care.  I discussed her colpo results and explained the procedure of LEEP.  All questions were answered and she signed the consent form.    LEEP performed in the usual manner after reviewing the previous colpo findings and results. Acetic acid solution was usesd to identify any abnormal areas of the cervix.  The cervix was cleansed with betadine solution. Local injection of Lidocaine was performed for anesthesia. Ectocervical and then endocervical specimens obtained using the loop electrodes without difficulty.  It was labeled accordingly. The base and edges of the defect were  hemostatic. No cautery needed. No monsels applied.  Endocervical curettage performed after the procedure.   Adelene Idlerhristanna Tuwanda Vokes, M.D. 03/15/2017 9:26 AM

## 2017-03-20 ENCOUNTER — Other Ambulatory Visit: Payer: Self-pay | Admitting: Obstetrics and Gynecology

## 2017-03-20 ENCOUNTER — Telehealth: Payer: Self-pay

## 2017-03-20 DIAGNOSIS — Z30011 Encounter for initial prescription of contraceptive pills: Secondary | ICD-10-CM

## 2017-03-20 LAB — PATHOLOGY

## 2017-03-20 MED ORDER — DROSPIRENONE-ETHINYL ESTRADIOL 3-0.02 MG PO TABS: 1.0000 | ORAL_TABLET | Freq: Every day | ORAL | 11 refills | Status: DC

## 2017-03-20 NOTE — Telephone Encounter (Signed)
Okay I can do that, prescription sent. Thank you.

## 2017-03-20 NOTE — Telephone Encounter (Signed)
Pt calling triage today wanting to know if CS can switch her OCP's from [facility] to [facility]. She is not having the best side effects from the RX. Please advise.

## 2017-03-21 NOTE — Telephone Encounter (Signed)
Pt aware.

## 2017-04-01 ENCOUNTER — Other Ambulatory Visit: Payer: Self-pay | Admitting: Obstetrics and Gynecology

## 2017-04-01 DIAGNOSIS — N760 Acute vaginitis: Secondary | ICD-10-CM

## 2017-04-01 MED ORDER — METRONIDAZOLE 500 MG PO TABS: 500.0000 mg | ORAL_TABLET | Freq: Two times a day (BID) | ORAL | 0 refills | Status: AC

## 2017-04-01 MED ORDER — VALACYCLOVIR HCL 500 MG PO TABS: 500.0000 mg | ORAL_TABLET | Freq: Two times a day (BID) | ORAL | 1 refills | Status: DC | PRN

## 2017-04-01 NOTE — Progress Notes (Signed)
Discussed result on phone an need for follow up in 12 months and again in 24 months for a pap smear.  Patient said she is having a thin grey discharge. Prescription for flagyl sent to pharmacy. Asked her to follow up if this does not improve.

## 2017-04-01 NOTE — Progress Notes (Signed)
Rx RF valtrex prn HSV.

## 2017-04-09 DIAGNOSIS — M7031 Other bursitis of elbow, right elbow: Secondary | ICD-10-CM | POA: Diagnosis not present

## 2017-04-09 DIAGNOSIS — S53401A Unspecified sprain of right elbow, initial encounter: Secondary | ICD-10-CM | POA: Diagnosis not present

## 2017-04-09 DIAGNOSIS — J3089 Other allergic rhinitis: Secondary | ICD-10-CM | POA: Diagnosis not present

## 2017-06-10 DIAGNOSIS — J101 Influenza due to other identified influenza virus with other respiratory manifestations: Secondary | ICD-10-CM | POA: Diagnosis not present

## 2017-06-10 DIAGNOSIS — J039 Acute tonsillitis, unspecified: Secondary | ICD-10-CM | POA: Diagnosis not present

## 2017-09-12 DIAGNOSIS — O3411 Maternal care for benign tumor of corpus uteri, first trimester: Secondary | ICD-10-CM | POA: Diagnosis not present

## 2017-09-12 DIAGNOSIS — Z3A01 Less than 8 weeks gestation of pregnancy: Secondary | ICD-10-CM | POA: Diagnosis not present

## 2017-09-12 DIAGNOSIS — Z3A16 16 weeks gestation of pregnancy: Secondary | ICD-10-CM | POA: Diagnosis not present

## 2017-09-12 DIAGNOSIS — N8312 Corpus luteum cyst of left ovary: Secondary | ICD-10-CM | POA: Diagnosis not present

## 2017-09-12 DIAGNOSIS — Z3482 Encounter for supervision of other normal pregnancy, second trimester: Secondary | ICD-10-CM | POA: Diagnosis not present

## 2017-10-18 ENCOUNTER — Other Ambulatory Visit (HOSPITAL_COMMUNITY)
Admission: RE | Admit: 2017-10-18 | Discharge: 2017-10-18 | Disposition: A | Discharge status: Home / Self Care | Payer: BLUE CROSS/BLUE SHIELD | Source: Ambulatory Visit | Attending: Maternal Newborn | Admitting: Maternal Newborn

## 2017-10-18 ENCOUNTER — Ambulatory Visit (INDEPENDENT_AMBULATORY_CARE_PROVIDER_SITE_OTHER): Payer: BLUE CROSS/BLUE SHIELD | Admitting: Maternal Newborn

## 2017-10-18 ENCOUNTER — Encounter: Payer: Self-pay | Admitting: Maternal Newborn

## 2017-10-18 VITALS — BP 98/60 | Wt 162.0 lb

## 2017-10-18 DIAGNOSIS — Z3201 Encounter for pregnancy test, result positive: Secondary | ICD-10-CM | POA: Diagnosis not present

## 2017-10-18 DIAGNOSIS — O3441 Maternal care for other abnormalities of cervix, first trimester: Secondary | ICD-10-CM

## 2017-10-18 DIAGNOSIS — Z9889 Other specified postprocedural states: Secondary | ICD-10-CM

## 2017-10-18 DIAGNOSIS — N912 Amenorrhea, unspecified: Secondary | ICD-10-CM

## 2017-10-18 DIAGNOSIS — O3440 Maternal care for other abnormalities of cervix, unspecified trimester: Secondary | ICD-10-CM

## 2017-10-18 DIAGNOSIS — Z3A01 Less than 8 weeks gestation of pregnancy: Secondary | ICD-10-CM

## 2017-10-18 DIAGNOSIS — Z3480 Encounter for supervision of other normal pregnancy, unspecified trimester: Secondary | ICD-10-CM

## 2017-10-18 DIAGNOSIS — Z113 Encounter for screening for infections with a predominantly sexual mode of transmission: Secondary | ICD-10-CM

## 2017-10-18 DIAGNOSIS — Z0189 Encounter for other specified special examinations: Secondary | ICD-10-CM

## 2017-10-18 LAB — POCT URINALYSIS DIPSTICK OB
Glucose, UA: NEGATIVE
PROTEIN: NEGATIVE

## 2017-10-18 LAB — POCT URINE PREGNANCY: PREG TEST UR: POSITIVE — AB

## 2017-10-18 NOTE — Progress Notes (Signed)
Initial Prenatal Visit- no concerns

## 2017-10-18 NOTE — Progress Notes (Addendum)
10/18/2017   Chief Complaint: Amenorrhea, positive home pregnancy test, desires prenatal care.  Transfer of Care Patient: no  History of Present Illness: [name] is a 27 y.o. G3P1011 at [redacted]w[redacted]d based on Patient's last menstrual period on 09/12/2017 (exact date), with an Estimated Date of Delivery: 06/19/2018, with the above CC.   Her periods were: regular periods every 28 days, lasting 3-4 days. She was using no method when she conceived.  She has Positive signs or symptoms of nausea of pregnancy. She has Negative signs or symptoms of miscarriage or preterm labor She identifies Negative Zika risk factors for her and her partner On any different medications around the time she conceived/early pregnancy: No  History of varicella: Yes    Review of Systems  Constitutional: Positive for appetite change and fatigue.  HENT: Negative.   Eyes: Negative.   Respiratory: Negative for cough, shortness of breath and wheezing.   Cardiovascular: Negative for chest pain and palpitations.  Gastrointestinal: Positive for constipation and nausea.  Endocrine: Negative.   Genitourinary: Positive for vaginal discharge.  Musculoskeletal: Negative.   Skin: Negative.   Allergic/Immunologic: Negative.   Neurological: Positive for headaches.  Hematological: Negative.   Psychiatric/Behavioral: Negative.   Breast ROS: positive for - breast tenderness  Review of systems otherwise negative, except as stated in the above HPI.  OBGYN History: As per HPI. OB History  Gravida Para Term Preterm AB Living  3 1 1   1 1   SAB TAB Ectopic Multiple Live Births  1       1    # Outcome Date GA Lbr Len/2nd Weight Sex Delivery Anes PTL Lv  3 Current           2 Term 07/07/12   8 lb 5 oz (3.771 kg) F Vag-Spont   LIV  1 SAB 2011             Birth Comments: REQUIRED D&C    Any issues with any prior pregnancies: SAB and D and C with G1. Any prior children are healthy, doing well, without any problems or issues:  yes History of pap smears: Yes. Last pap smear 01/01/2017. Abnormal:  ASCUS, colposcopy showed CIN II, LEEP in 03/2017 History of STIs: Yes, HSV   Past Medical History: Past Medical History:  Diagnosis Date  . Abnormal Pap smear of cervix 10/2010, 11/15   lsil, ASCUS  . Cervical high risk HPV (human papillomavirus) test positive 2015, 2018  . Herpes 01/2014   outbreak on cx; Pos HSV 2 IgG  . Immunization, viral disease    gardasil completed    Past Surgical History: Past Surgical History:  Procedure Laterality Date  . DILATION AND CURETTAGE OF UTERUS  2011   PJR  . IUD REMOVAL  02/2017  . WISDOM TOOTH EXTRACTION      Family History:  History reviewed. Unremarkable family history.  She denies any female cancers, bleeding or blood clotting disorders.  She denies any history of intellectual disability, birth defects or genetic disorders in her or the FOB's history  Social History:  Social History   Socioeconomic History  . Marital status: Single    Spouse name: Not on file  . Number of children: Not on file  . Years of education: Not on file  . Highest education level: Not on file  Occupational History  . Not on file  Social Needs  . Financial resource strain: Not on file  . Food insecurity:    Worry: Not on file  Inability: Not on file  . Transportation needs:    Medical: Not on file    Non-medical: Not on file  Tobacco Use  . Smoking status: Never Smoker  . Smokeless tobacco: Never Used  Substance and Sexual Activity  . Alcohol use: Yes    Comment: socially  . Drug use: No  . Sexual activity: Yes    Birth control/protection: IUD  Lifestyle  . Physical activity:    Days per week: Not on file    Minutes per session: Not on file  . Stress: Not on file  Relationships  . Social connections:    Talks on phone: Not on file    Gets together: Not on file    Attends religious service: Not on file    Active member of club or organization: Not on file     Attends meetings of clubs or organizations: Not on file    Relationship status: Not on file  . Intimate partner violence:    Fear of current or ex partner: Not on file    Emotionally abused: Not on file    Physically abused: Not on file    Forced sexual activity: Not on file  Other Topics Concern  . Not on file  Social History Narrative  . Not on file   Any cats in the household: no No history of or current problems with domestic violence.  Allergy: No Known Allergies  Current Outpatient Medications: No current outpatient medications on file.   Physical Exam:   BP 98/60   Wt 162 lb (73.5 kg)   LMP 09/12/2017 (Exact Date)   BMI 25.37 kg/m  Body mass index is 25.37 kg/m. Constitutional: Well nourished, well developed female in no acute distress.  Neck:  Supple, normal appearance, and no thyromegaly  Cardiovascular: S1, S2 normal, no murmur, rub or gallop, regular rate and rhythm Respiratory:  Clear to auscultation bilaterally. Normal respiratory effort Abdomen: no masses, hernias; diffusely non tender to palpation, non distended Breasts: patient declines to have breast exam. Neuro/Psych:  Normal mood and affect.  Skin:  Warm and dry.  Lymphatic:  No inguinal lymphadenopathy.   Pelvic exam: declined after shared decision-making, external genitalia normal with no lesions, GC collected.  Assessment: [name] is a 27 y.o. G3P1011 at [redacted]w[redacted]d based on Patient's last menstrual period on 09/12/2017 (exact date), with an Estimated Date of Delivery: 06/19/2018, presenting for prenatal care.  Plan:  1) Avoid alcoholic beverages. 2) Patient encouraged not to smoke.  3) Discontinue the use of all non-medicinal drugs and chemicals.  4) Take prenatal vitamins daily.  5) Seatbelt use advised 6) Nutrition, food safety (fish, cheese advisories, and high nitrite foods) and exercise discussed. 7) Hospital and practice style delivering at [facility] discussed  8) Patient is asked about travel  to areas at risk for the Zika virus, and counseled to avoid travel and exposure to mosquitoes or sexual partners who may have themselves been exposed to the virus. Testing is discussed, and will be ordered as appropriate.  9) Childbirth classes at [facility] advised 10) Genetic Screening, such as with 1st Trimester Screening, cell free fetal DNA, AFP testing, and Ultrasound, as well as with amniocentesis and [facility] as appropriate, is discussed with patient. She is considering genetic testing this pregnancy. 11) Ultrasound ordered for dating and viability. 12) [place] samples given; she will call for Rx if effective.  Problem list reviewed and updated.  Return in about 1 week (around 10/25/2017) for ROB and dating scan.  Pamela Bishop, CNM [facility], [facility] 10/18/2017  1:44 PM

## 2017-10-19 LAB — URINE DRUG PANEL 7
AMPHETAMINES, URINE: NEGATIVE ng/mL
BARBITURATE QUANT UR: NEGATIVE ng/mL
Benzodiazepine Quant, Ur: NEGATIVE ng/mL
CANNABINOID QUANT UR: NEGATIVE ng/mL
Cocaine (Metab.): NEGATIVE ng/mL
OPIATE QUANT UR: NEGATIVE ng/mL
PCP Quant, Ur: NEGATIVE ng/mL

## 2017-10-19 LAB — RPR+RH+ABO+RUB AB+AB SCR+CB...
Antibody Screen: NEGATIVE
HEMATOCRIT: 37.7 % (ref 34.0–46.6)
HEP B S AG: NEGATIVE
HIV SCREEN 4TH GENERATION: NONREACTIVE
Hemoglobin: 12.4 g/dL (ref 11.1–15.9)
MCH: 27.9 pg (ref 26.6–33.0)
MCHC: 32.9 g/dL (ref 31.5–35.7)
MCV: 85 fL (ref 79–97)
Platelets: 267 10*3/uL (ref 150–450)
RBC: 4.44 x10E6/uL (ref 3.77–5.28)
RDW: 12.1 % — AB (ref 12.3–15.4)
RPR: NONREACTIVE
Rh Factor: NEGATIVE
Rubella Antibodies, IGG: <0.9 index — ABNORMAL LOW (ref >0.99)
VARICELLA: 1841 {index} (ref >165)
WBC: 5.5 10*3/uL (ref 3.4–10.8)

## 2017-10-20 LAB — URINE CULTURE: Organism ID, Bacteria: NO GROWTH

## 2017-10-21 ENCOUNTER — Other Ambulatory Visit: Payer: Self-pay | Admitting: Maternal Newborn

## 2017-10-21 ENCOUNTER — Encounter: Payer: Self-pay | Admitting: Maternal Newborn

## 2017-10-21 DIAGNOSIS — Z3480 Encounter for supervision of other normal pregnancy, unspecified trimester: Secondary | ICD-10-CM | POA: Insufficient documentation

## 2017-10-21 DIAGNOSIS — B3731 Acute candidiasis of vulva and vagina: Secondary | ICD-10-CM

## 2017-10-21 DIAGNOSIS — Z9889 Other specified postprocedural states: Secondary | ICD-10-CM | POA: Insufficient documentation

## 2017-10-21 DIAGNOSIS — B373 Candidiasis of vulva and vagina: Secondary | ICD-10-CM

## 2017-10-21 DIAGNOSIS — O3440 Maternal care for other abnormalities of cervix, unspecified trimester: Secondary | ICD-10-CM | POA: Insufficient documentation

## 2017-10-21 LAB — CERVICOVAGINAL ANCILLARY ONLY
Bacterial vaginitis: NEGATIVE
CHLAMYDIA, DNA PROBE: NEGATIVE
Candida vaginitis: POSITIVE — AB
Neisseria Gonorrhea: NEGATIVE

## 2017-10-21 MED ORDER — TERCONAZOLE 0.4 % VA CREA: 1.0000 | TOPICAL_CREAM | Freq: Every day | VAGINAL | 0 refills | Status: AC

## 2017-10-21 NOTE — Progress Notes (Signed)
Rx sent for terconazole to treat yeast infection. 

## 2017-10-28 ENCOUNTER — Telehealth: Payer: Self-pay

## 2017-10-28 ENCOUNTER — Other Ambulatory Visit: Payer: BLUE CROSS/BLUE SHIELD

## 2017-10-28 ENCOUNTER — Encounter: Payer: BLUE CROSS/BLUE SHIELD | Admitting: Obstetrics & Gynecology

## 2017-10-28 NOTE — Telephone Encounter (Signed)
Pt calling for test results [phone]

## 2017-10-29 ENCOUNTER — Ambulatory Visit (INDEPENDENT_AMBULATORY_CARE_PROVIDER_SITE_OTHER): Payer: BLUE CROSS/BLUE SHIELD | Admitting: Obstetrics and Gynecology

## 2017-10-29 ENCOUNTER — Encounter: Payer: Self-pay | Admitting: Obstetrics and Gynecology

## 2017-10-29 ENCOUNTER — Ambulatory Visit (INDEPENDENT_AMBULATORY_CARE_PROVIDER_SITE_OTHER): Payer: BLUE CROSS/BLUE SHIELD

## 2017-10-29 VITALS — BP 102/58 | Wt 161.0 lb

## 2017-10-29 DIAGNOSIS — N8312 Corpus luteum cyst of left ovary: Secondary | ICD-10-CM | POA: Diagnosis not present

## 2017-10-29 DIAGNOSIS — O210 Mild hyperemesis gravidarum: Secondary | ICD-10-CM

## 2017-10-29 DIAGNOSIS — O3441 Maternal care for other abnormalities of cervix, first trimester: Secondary | ICD-10-CM

## 2017-10-29 DIAGNOSIS — O3411 Maternal care for benign tumor of corpus uteri, first trimester: Secondary | ICD-10-CM | POA: Diagnosis not present

## 2017-10-29 DIAGNOSIS — Z9889 Other specified postprocedural states: Secondary | ICD-10-CM

## 2017-10-29 DIAGNOSIS — Z3A01 Less than 8 weeks gestation of pregnancy: Secondary | ICD-10-CM

## 2017-10-29 DIAGNOSIS — Z3480 Encounter for supervision of other normal pregnancy, unspecified trimester: Secondary | ICD-10-CM

## 2017-10-29 DIAGNOSIS — O3440 Maternal care for other abnormalities of cervix, unspecified trimester: Secondary | ICD-10-CM

## 2017-10-29 DIAGNOSIS — Z0189 Encounter for other specified special examinations: Secondary | ICD-10-CM

## 2017-10-29 MED ORDER — DOXYLAMINE-PYRIDOXINE 10-10 MG PO TBEC: 2.0000 | DELAYED_RELEASE_TABLET | Freq: Every day | ORAL | 5 refills | Status: AC

## 2017-10-29 NOTE — Progress Notes (Signed)
ROB/Dating C/o Nausea and trouble sleeping  Declines flu shot

## 2017-10-29 NOTE — Progress Notes (Signed)
Routine Prenatal Care Visit  Subjective  Pamela Bishop is a 27 y.o. G3P1011 at 9112w5d being seen today for ongoing prenatal care.  She is currently monitored for the following issues for this low-risk pregnancy and has Dysplasia of cervix, high grade CIN 2; History of loop electrosurgical excision procedure (LEEP) affecting care of mother, antepartum; and Supervision of other normal pregnancy, antepartum on their problem list.  ----------------------------------------------------------------------------------- Patient reports no complaints.   Contractions: Not present. Vag. Bleeding: None.   . Denies leaking of fluid.  ----------------------------------------------------------------------------------- The following portions of the patient's history were reviewed and updated as appropriate: allergies, current medications, past family history, past medical history, past social history, past surgical history and problem list. Problem list updated.   Objective  Blood pressure (!) 102/58, weight 161 lb (73 kg), last menstrual period 09/12/2017. Pregravid weight 160 lb (72.6 kg) Total Weight Gain 1 lb (0.454 kg) Urinalysis:      Fetal Status: Fetal Heart Rate (bpm): 120         General:  Alert, oriented and cooperative. Patient is in no acute distress.  Skin: Skin is warm and dry. No rash noted.   Cardiovascular: Normal heart rate noted  Respiratory: Normal respiratory effort, no problems with respiration noted  Abdomen: Soft, gravid, appropriate for gestational age. Pain/Pressure: Absent     Pelvic:  Cervical exam deferred        Extremities: Normal range of motion.     ental Status: Normal mood and affect. Normal behavior. Normal judgment and thought content.     Assessment   27 y.o. G3P1011 at 5012w5d by  06/19/2018, by Last Menstrual Period presenting for routine prenatal visit  Plan   THIRD Problems (from 10/18/17 to present)    Problem Noted Resolved   History of loop  electrosurgical excision procedure (LEEP) affecting care of mother, antepartum 10/21/2017 by Oswaldo ConroySchmid, Jacelyn Y, CNM No   Supervision of other normal pregnancy, antepartum 10/21/2017 by Oswaldo ConroySchmid, Jacelyn Y, CNM No   Overview Addendum 10/29/2017  4:27 PM by Natale MilchSchuman, Ibrohim Simmers R, MD    [facility]inic [facility] Prenatal Labs  Dating  LMP=6wk US Blood type: B/Negative/-- (09/06 1422)   Genetic Screen DECLINES Antibody:Negative (09/06 1422)  Anatomic US  Rubella: <0.90 Nonimmune  Varicella: Immune  GTT     Third trimester:  RPR: Non Reactive (09/06 1422)   Rhogam  [ ]   HBsAg: Negative (09/06 1422)   TDaP vaccine                        Flu Shot: Declines HIV: Non Reactive (09/06 1422)   Baby Food                                GBS:   Contraception  Pap: LEEP 03/2017, CIN II  CBB     CS/VBAC    Support Person                  Gestational age appropriate obstetric precautions including but not limited to vaginal bleeding, contractions, leaking of fluid and fetal movement were reviewed in detail with the patient.    Nausea and food aversions, limited diet, many foods sound disgusting. Williams Cheried Bonjesta with limited improvement. Will try Diclegis for 1 week and let us know if she does not have improvement of her symptoms. Discussed small frequent meals and BRAT diet.   Return in about  4 weeks (around 11/26/2017) for ROB.  Adelene Idler MD [facility], [facility] 10/29/17 4:28 PM

## 2017-11-14 DIAGNOSIS — H6983 Other specified disorders of Eustachian tube, bilateral: Secondary | ICD-10-CM | POA: Diagnosis not present

## 2017-11-27 ENCOUNTER — Ambulatory Visit (INDEPENDENT_AMBULATORY_CARE_PROVIDER_SITE_OTHER): Payer: BLUE CROSS/BLUE SHIELD | Admitting: Obstetrics and Gynecology

## 2017-11-27 ENCOUNTER — Encounter: Payer: Self-pay | Admitting: Obstetrics and Gynecology

## 2017-11-27 VITALS — BP 100/62 | Wt 165.0 lb

## 2017-11-27 DIAGNOSIS — Z3A10 10 weeks gestation of pregnancy: Secondary | ICD-10-CM

## 2017-11-27 DIAGNOSIS — Z3480 Encounter for supervision of other normal pregnancy, unspecified trimester: Secondary | ICD-10-CM

## 2017-11-27 DIAGNOSIS — Z1379 Encounter for other screening for genetic and chromosomal anomalies: Secondary | ICD-10-CM

## 2017-11-27 DIAGNOSIS — O3440 Maternal care for other abnormalities of cervix, unspecified trimester: Secondary | ICD-10-CM

## 2017-11-27 DIAGNOSIS — Z3686 Encounter for antenatal screening for cervical length: Secondary | ICD-10-CM

## 2017-11-27 DIAGNOSIS — Z23 Encounter for immunization: Secondary | ICD-10-CM | POA: Diagnosis not present

## 2017-11-27 DIAGNOSIS — Z9889 Other specified postprocedural states: Secondary | ICD-10-CM

## 2017-11-27 NOTE — Progress Notes (Signed)
Routine Prenatal Care Visit  Subjective  Pamela Bishop is a 27 y.o. G3P1011 at [redacted]w[redacted]d being seen today for ongoing prenatal care.  She is currently monitored for the following issues for this low-risk pregnancy and has Dysplasia of cervix, high grade CIN 2; History of loop electrosurgical excision procedure (LEEP) affecting care of mother, antepartum; and Supervision of other normal pregnancy, antepartum on their problem list.  ----------------------------------------------------------------------------------- Patient reports no complaints.   Contractions: Not present. Vag. Bleeding: None.   . Denies leaking of fluid.  ----------------------------------------------------------------------------------- The following portions of the patient's history were reviewed and updated as appropriate: allergies, current medications, past family history, past medical history, past social history, past surgical history and problem list. Problem list updated.   Objective  Blood pressure 100/62, weight 165 lb (74.8 kg), last menstrual period 09/12/2017. Pregravid weight 160 lb (72.6 kg) Total Weight Gain 5 lb (2.268 kg) Urinalysis:      Fetal Status: Fetal Heart Rate (bpm): 172         General:  Alert, oriented and cooperative. Patient is in no acute distress.  Skin: Skin is warm and dry. No rash noted.   Cardiovascular: Normal heart rate noted  Respiratory: Normal respiratory effort, no problems with respiration noted  Abdomen: Soft, gravid, appropriate for gestational age. Pain/Pressure: Absent     Pelvic:  Cervical exam deferred        Extremities: Normal range of motion.     ental Status: Normal mood and affect. Normal behavior. Normal judgment and thought content.     Assessment   27 y.o. G3P1011 at [redacted]w[redacted]d by  06/19/2018, by Last Menstrual Period presenting for routine prenatal visit  Plan   THIRD Problems (from 10/18/17 to present)    Problem Noted Resolved   History of loop  electrosurgical excision procedure (LEEP) affecting care of mother, antepartum 10/21/2017 by Oswaldo Conroy, CNM No   Supervision of other normal pregnancy, antepartum 10/21/2017 by Oswaldo Conroy, CNM No   Overview Addendum 11/27/2017  9:39 AM by Natale Milch, MD    [facility]inic [facility] Prenatal Labs  Dating  LMP=6wk [place] Blood type: B/Negative/-- (09/06 1422)   Genetic Screen [ ]  Maternit21 Antibody:Negative (09/06 1422)  Anatomic [place]  Rubella: <0.90 Nonimmune  Varicella: Immune  GTT     Third trimester:  RPR: Non Reactive (09/06 1422)   Rhogam  [ ]   HBsAg: Negative (09/06 1422)   TDaP vaccine                        Flu Shot: Declines HIV: Non Reactive (09/06 1422)   Baby Food                                GBS:   Contraception  Pap: LEEP 03/2017, CIN II  CBB     CS/VBAC    Support Person                  Gestational age appropriate obstetric precautions including but not limited to vaginal bleeding, contractions, leaking of fluid and fetal movement were reviewed in detail with the patient.    Discussed supportive care of sore throat. Given list of prenatal classes Would like to have Maternit21 testing. Would like a gender envelope.  Follow up in 5 weeks for ROB and [place] for cervical length screening.   Return in about 5 weeks (around 01/01/2018)  for ROB and [place].  Adelene Idler MD [facility], [facility] 11/27/17 9:49 AM

## 2017-11-27 NOTE — Progress Notes (Signed)
ROB C/o sore throat  Flu shot today

## 2017-12-02 LAB — MATERNIT 21 PLUS CORE, BLOOD
CHROMOSOME 13: NEGATIVE
CHROMOSOME 18: NEGATIVE
Chromosome 21: NEGATIVE
Y CHROMOSOME: NOT DETECTED

## 2017-12-03 NOTE — Progress Notes (Signed)
Discussed on phone, will come pick up a gender envelope. Released to mychart.

## 2017-12-03 NOTE — Progress Notes (Signed)
Please make a gender envelop for the patient. Thank you.

## 2017-12-13 ENCOUNTER — Encounter: Payer: BLUE CROSS/BLUE SHIELD | Admitting: Obstetrics and Gynecology

## 2017-12-13 DIAGNOSIS — O360910 Maternal care for other rhesus isoimmunization, first trimester, not applicable or unspecified: Secondary | ICD-10-CM | POA: Diagnosis not present

## 2017-12-13 DIAGNOSIS — O209 Hemorrhage in early pregnancy, unspecified: Secondary | ICD-10-CM | POA: Diagnosis not present

## 2017-12-13 DIAGNOSIS — O200 Threatened abortion: Secondary | ICD-10-CM | POA: Diagnosis not present

## 2017-12-13 DIAGNOSIS — Z3A13 13 weeks gestation of pregnancy: Secondary | ICD-10-CM | POA: Diagnosis not present

## 2017-12-16 ENCOUNTER — Ambulatory Visit (INDEPENDENT_AMBULATORY_CARE_PROVIDER_SITE_OTHER): Payer: BLUE CROSS/BLUE SHIELD | Admitting: Obstetrics and Gynecology

## 2017-12-16 ENCOUNTER — Telehealth: Payer: Self-pay

## 2017-12-16 VITALS — BP 100/60 | Wt 171.5 lb

## 2017-12-16 DIAGNOSIS — N898 Other specified noninflammatory disorders of vagina: Secondary | ICD-10-CM | POA: Diagnosis not present

## 2017-12-16 DIAGNOSIS — O3441 Maternal care for other abnormalities of cervix, first trimester: Secondary | ICD-10-CM

## 2017-12-16 DIAGNOSIS — Z9889 Other specified postprocedural states: Secondary | ICD-10-CM

## 2017-12-16 DIAGNOSIS — N888 Other specified noninflammatory disorders of cervix uteri: Secondary | ICD-10-CM

## 2017-12-16 DIAGNOSIS — O360110 Maternal care for anti-D [Rh] antibodies, first trimester, not applicable or unspecified: Secondary | ICD-10-CM

## 2017-12-16 DIAGNOSIS — O26899 Other specified pregnancy related conditions, unspecified trimester: Secondary | ICD-10-CM

## 2017-12-16 DIAGNOSIS — Z3480 Encounter for supervision of other normal pregnancy, unspecified trimester: Secondary | ICD-10-CM

## 2017-12-16 DIAGNOSIS — O3440 Maternal care for other abnormalities of cervix, unspecified trimester: Secondary | ICD-10-CM

## 2017-12-16 DIAGNOSIS — O26891 Other specified pregnancy related conditions, first trimester: Secondary | ICD-10-CM

## 2017-12-16 DIAGNOSIS — Z3A13 13 weeks gestation of pregnancy: Secondary | ICD-10-CM

## 2017-12-16 DIAGNOSIS — Z6791 Unspecified blood type, Rh negative: Secondary | ICD-10-CM

## 2017-12-16 NOTE — Progress Notes (Signed)
Ob problem visit C/o brown discharge starting on Friday still there, same as it has been

## 2017-12-16 NOTE — Telephone Encounter (Signed)
Pt is 13 1/2 wks preg; has brown d/c; went to [facility] ED in [facility] Saturday - they did a cx and told her everything looked good;  Today it is heavier.  [phone]  With pt's hx of LEEP, tx'd to [facility] to sched workin appt.

## 2017-12-17 NOTE — Progress Notes (Signed)
Routine Prenatal Care Visit  Subjective  Pamela Bishop is a 27 y.o. G3P1011 at [redacted]w[redacted]d being seen today for ongoing prenatal care.  She is currently monitored for the following issues for this low-risk pregnancy and has Dysplasia of cervix, high grade CIN 2; History of loop electrosurgical excision procedure (LEEP) affecting care of mother, antepartum; Supervision of other normal pregnancy, antepartum; and Rh negative state in antepartum period on their problem list.  ----------------------------------------------------------------------------------- Patient reports that she has had brown vaginal spotting since Friday of last week. She was seen at a [facility] medicine urgent care who discussed that this was a threatened abortion. She reports her last prior intercourse was on Wednesday of last week. She has been on pelvic rest and has not been exercising. She was exercising before and doing moderate intensitiy workouts and weight lifting with a bar. She denies pain or cramping.  She did receive Rhogam at the ER on 12/13/17.  Contractions: Not present. Vag. Bleeding: Other.   . Denies leaking of fluid.  ----------------------------------------------------------------------------------- The following portions of the patient's history were reviewed and updated as appropriate: allergies, current medications, past family history, past medical history, past social history, past surgical history and problem list. Problem list updated.   Objective  Blood pressure 100/60, weight 171 lb 8 oz (77.8 kg), last menstrual period 09/12/2017. Pregravid weight 160 lb (72.6 kg) Total Weight Gain 11 lb 8 oz (5.216 kg) Urinalysis:      Fetal Status: Fetal Heart Rate (bpm): 170         General:  Alert, oriented and cooperative. Patient is in no acute distress.  Skin: Skin is warm and dry. No rash noted.   Cardiovascular: Normal heart rate noted  Respiratory: Normal respiratory effort, no problems with respiration noted   Abdomen: Soft, gravid, appropriate for gestational age. Pain/Pressure: Absent     Pelvic:  Cervical exam performed. Normal cervix on speculum exam. No active bleeding. Small brown discharge present. Normal appearing cervix. Closed on digital examination Dilation: Closed Effacement (%): 0 Station: -3  Extremities: Normal range of motion.     Mental Status: Normal mood and affect. Normal behavior. Normal judgment and thought content.   Bedside US showed normal appearing fetus and placenta with reassuring fetal heart tones. No evidence of subchorionic hemorrhage.    Assessment   27 y.o. G3P1011 at [redacted]w[redacted]d by  06/19/2018, by Last Menstrual Period presenting for work-in prenatal visit  Plan   THIRD Problems (from 10/18/17 to present)    Problem Noted Resolved   History of loop electrosurgical excision procedure (LEEP) affecting care of mother, antepartum 10/21/2017 by Oswaldo Conroy, CNM No   Supervision of other normal pregnancy, antepartum 10/21/2017 by Oswaldo Conroy, CNM No   Overview Addendum 11/27/2017  9:39 AM by Natale Milch, MD    [facility]inic [facility] Prenatal Labs  Dating  LMP=6wk [place] Blood type: B/Negative/-- (09/06 1422)   Genetic Screen [ ]  Maternit21 Antibody:Negative (09/06 1422)  Anatomic [place]  Rubella: <0.90 Nonimmune  Varicella: Immune  GTT     Third trimester:  RPR: Non Reactive (09/06 1422)   Rhogam  [ ]   HBsAg: Negative (09/06 1422)   TDaP vaccine                        Flu Shot: Declines HIV: Non Reactive (09/06 1422)   Baby Food  GBS:   Contraception  Pap: LEEP 03/2017, CIN II  CBB     CS/VBAC    Support Person                  Gestational age appropriate obstetric precautions including but not limited to vaginal bleeding, contractions, leaking of fluid and fetal movement were reviewed in detail with the patient.    No active bleeding. Discussed activity restrictions and advised pelvic rest for 1 week.  She will call if the  discharge does not resolve or worsens.  Rhogam given in ER.   Follow up as planned. With a TVUS at her next visit.   No follow-ups on file.  Natale Milch MD [facility], [facility] 12/18/2017, 10:25 AM

## 2017-12-18 ENCOUNTER — Encounter: Payer: Self-pay | Admitting: Obstetrics and Gynecology

## 2017-12-18 DIAGNOSIS — O26899 Other specified pregnancy related conditions, unspecified trimester: Secondary | ICD-10-CM | POA: Insufficient documentation

## 2017-12-18 DIAGNOSIS — Z6791 Unspecified blood type, Rh negative: Secondary | ICD-10-CM | POA: Insufficient documentation

## 2017-12-20 LAB — NUSWAB VAGINITIS PLUS (VG+)
Candida albicans, NAA: NEGATIVE
Candida glabrata, NAA: NEGATIVE
Chlamydia trachomatis, NAA: NEGATIVE
MEGASPHAERA 1: HIGH {score} — AB
Neisseria gonorrhoeae, NAA: NEGATIVE
Trich vag by NAA: NEGATIVE

## 2017-12-23 ENCOUNTER — Other Ambulatory Visit: Payer: Self-pay | Admitting: Obstetrics and Gynecology

## 2017-12-23 DIAGNOSIS — N760 Acute vaginitis: Principal | ICD-10-CM

## 2017-12-23 DIAGNOSIS — B9689 Other specified bacterial agents as the cause of diseases classified elsewhere: Secondary | ICD-10-CM

## 2017-12-23 MED ORDER — METRONIDAZOLE 500 MG PO TABS: 500.0000 mg | ORAL_TABLET | Freq: Two times a day (BID) | ORAL | 0 refills | Status: AC

## 2017-12-23 NOTE — Progress Notes (Signed)
BV, discused on phone, sent prescription to pharmacy. Patient has been feeling better. No further bleeding.

## 2017-12-29 ENCOUNTER — Encounter (HOSPITAL_COMMUNITY): Payer: Self-pay

## 2018-01-02 ENCOUNTER — Ambulatory Visit (INDEPENDENT_AMBULATORY_CARE_PROVIDER_SITE_OTHER): Payer: BLUE CROSS/BLUE SHIELD | Admitting: Obstetrics and Gynecology

## 2018-01-02 ENCOUNTER — Ambulatory Visit (INDEPENDENT_AMBULATORY_CARE_PROVIDER_SITE_OTHER): Payer: BLUE CROSS/BLUE SHIELD

## 2018-01-02 VITALS — BP 100/60 | Wt 180.5 lb

## 2018-01-02 DIAGNOSIS — Z3482 Encounter for supervision of other normal pregnancy, second trimester: Secondary | ICD-10-CM

## 2018-01-02 DIAGNOSIS — Z3480 Encounter for supervision of other normal pregnancy, unspecified trimester: Secondary | ICD-10-CM

## 2018-01-02 DIAGNOSIS — Z3A16 16 weeks gestation of pregnancy: Secondary | ICD-10-CM

## 2018-01-02 DIAGNOSIS — Z3686 Encounter for antenatal screening for cervical length: Secondary | ICD-10-CM | POA: Diagnosis not present

## 2018-01-02 NOTE — Progress Notes (Signed)
Routine Prenatal Care Visit  Subjective  Pamela Bishop is a 27 y.o. G3P1011 at 6336w0d being seen today for ongoing prenatal care.  She is currently monitored for the following issues for this low-risk pregnancy and has Dysplasia of cervix, high grade CIN 2; History of loop electrosurgical excision procedure (LEEP) affecting care of mother, antepartum; Supervision of other normal pregnancy, antepartum; and Rh negative state in antepartum period on their problem list.  ----------------------------------------------------------------------------------- Patient reports no complaints.  Bleeding has stopped. She has reduced activity.  Contractions: Not present. Vag. Bleeding: None.   . Denies leaking of fluid.  ----------------------------------------------------------------------------------- The following portions of the patient's history were reviewed and updated as appropriate: allergies, current medications, past family history, past medical history, past social history, past surgical history and problem list. Problem list updated.   Objective  Blood pressure 100/60, weight 180 lb 8 oz (81.9 kg), last menstrual period 09/12/2017. Pregravid weight 160 lb (72.6 kg) Total Weight Gain 20 lb 8 oz (9.299 kg) Urinalysis:      Fetal Status: Fetal Heart Rate (bpm): 160         General:  Alert, oriented and cooperative. Patient is in no acute distress.  Skin: Skin is warm and dry. No rash noted.   Cardiovascular: Normal heart rate noted  Respiratory: Normal respiratory effort, no problems with respiration noted  Abdomen: Soft, gravid, appropriate for gestational age. Pain/Pressure: Absent     Pelvic:  Cervical exam deferred        Extremities: Normal range of motion.     Mental Status: Normal mood and affect. Normal behavior. Normal judgment and thought content.     Assessment   27 y.o. G3P1011 at 2036w0d by  06/19/2018, by Last Menstrual Period presenting for routine prenatal visit  Plan    THIRD Problems (from 10/18/17 to present)    Problem Noted Resolved   Rh negative state in antepartum period 12/18/2017 by Natale MilchSchuman, Torryn Fiske R, MD No   History of loop electrosurgical excision procedure (LEEP) affecting care of mother, antepartum 10/21/2017 by Oswaldo ConroySchmid, Jacelyn Y, CNM No   Supervision of other normal pregnancy, antepartum 10/21/2017 by Oswaldo ConroySchmid, Jacelyn Y, CNM No   Overview Addendum 01/02/2018 10:24 AM by Natale MilchSchuman, Lashia Niese R, MD    [facility]inic [facility] Prenatal Labs  Dating  LMP=6wk US Blood type: B/Negative/-- (09/06 1422)   Genetic Screen  Maternit21: normal XX Antibody:Negative (09/06 1422)  Anatomic US  Rubella: <0.90 Nonimmune  Varicella: Immune  GTT     Third trimester:  RPR: Non Reactive (09/06 1422)   Rhogam  [ ]   HBsAg: Negative (09/06 1422)   TDaP vaccine                        Flu Shot: Declines HIV: Non Reactive (09/06 1422)   Baby Food                                GBS:   Contraception  Pap: LEEP 03/2017, CIN II  CBB     CS/VBAC    Support Person                  Gestational age appropriate obstetric precautions including but not limited to vaginal bleeding, contractions, leaking of fluid and fetal movement were reviewed in detail with the patient.    Planning on transferring care  Return in about 4 weeks (around 01/30/2018) for  ROB and [place].  Natale Milch MD [facility], [facility] 01/02/2018, 10:25 AM

## 2018-01-02 NOTE — Progress Notes (Signed)
ROB/US No concerns   

## 2018-01-03 ENCOUNTER — Telehealth: Payer: Self-pay

## 2018-01-03 NOTE — Telephone Encounter (Signed)
Spoke with pt she is having some lower back pain, no spotting or cramping, advised pt to drink water and put her feet up, put heat on her lower back when she gets home, if she feels like it is getting worse or does start having cramping, spotting or is unable to take the pain she needs to be seen in the ED, pt is aware and stated she doesn't feel like it is something urgent but wanted to get some advice on what she could do. Pt is taking tylenol for the pain it is helping.

## 2018-01-03 NOTE — Telephone Encounter (Signed)
Pt calling today saying that she is having some pain on both sides, she had her appt with CS yesterday and u/s follow up and wanting to speak with CS nurse. Please advise.

## 2018-01-28 DIAGNOSIS — Z3689 Encounter for other specified antenatal screening: Secondary | ICD-10-CM | POA: Diagnosis not present

## 2018-01-28 DIAGNOSIS — Z9889 Other specified postprocedural states: Secondary | ICD-10-CM | POA: Diagnosis not present

## 2018-01-28 DIAGNOSIS — O43192 Other malformation of placenta, second trimester: Secondary | ICD-10-CM | POA: Diagnosis not present

## 2018-01-28 DIAGNOSIS — Z3A20 20 weeks gestation of pregnancy: Secondary | ICD-10-CM | POA: Diagnosis not present

## 2018-01-28 DIAGNOSIS — Z3492 Encounter for supervision of normal pregnancy, unspecified, second trimester: Secondary | ICD-10-CM | POA: Diagnosis not present

## 2018-01-30 ENCOUNTER — Other Ambulatory Visit: Payer: BLUE CROSS/BLUE SHIELD

## 2018-01-30 ENCOUNTER — Encounter: Payer: BLUE CROSS/BLUE SHIELD | Admitting: Obstetrics and Gynecology

## 2018-01-30 DIAGNOSIS — Z3A20 20 weeks gestation of pregnancy: Secondary | ICD-10-CM | POA: Diagnosis not present

## 2018-01-30 DIAGNOSIS — O43192 Other malformation of placenta, second trimester: Secondary | ICD-10-CM | POA: Diagnosis not present

## 2018-03-27 DIAGNOSIS — Z23 Encounter for immunization: Secondary | ICD-10-CM | POA: Diagnosis not present

## 2018-03-27 DIAGNOSIS — Z3492 Encounter for supervision of normal pregnancy, unspecified, second trimester: Secondary | ICD-10-CM | POA: Diagnosis not present

## 2018-03-28 DIAGNOSIS — Z3A28 28 weeks gestation of pregnancy: Secondary | ICD-10-CM | POA: Diagnosis not present

## 2018-03-28 DIAGNOSIS — O360131 Maternal care for anti-D [Rh] antibodies, third trimester, fetus 1: Secondary | ICD-10-CM | POA: Diagnosis not present

## 2018-04-15 DIAGNOSIS — J101 Influenza due to other identified influenza virus with other respiratory manifestations: Secondary | ICD-10-CM | POA: Diagnosis not present

## 2018-04-24 DIAGNOSIS — O43193 Other malformation of placenta, third trimester: Secondary | ICD-10-CM | POA: Diagnosis not present

## 2018-04-24 DIAGNOSIS — Z3A32 32 weeks gestation of pregnancy: Secondary | ICD-10-CM | POA: Diagnosis not present

## 2018-04-28 DIAGNOSIS — R05 Cough: Secondary | ICD-10-CM | POA: Diagnosis not present

## 2018-04-28 DIAGNOSIS — Z20818 Contact with and (suspected) exposure to other bacterial communicable diseases: Secondary | ICD-10-CM | POA: Diagnosis not present

## 2018-05-08 DIAGNOSIS — Z3493 Encounter for supervision of normal pregnancy, unspecified, third trimester: Secondary | ICD-10-CM | POA: Diagnosis not present

## 2018-05-08 DIAGNOSIS — B009 Herpesviral infection, unspecified: Secondary | ICD-10-CM | POA: Diagnosis not present

## 2018-05-29 DIAGNOSIS — Z3493 Encounter for supervision of normal pregnancy, unspecified, third trimester: Secondary | ICD-10-CM | POA: Diagnosis not present

## 2018-06-05 DIAGNOSIS — O43193 Other malformation of placenta, third trimester: Secondary | ICD-10-CM | POA: Diagnosis not present

## 2018-06-11 DIAGNOSIS — O99824 Streptococcus B carrier state complicating childbirth: Secondary | ICD-10-CM | POA: Diagnosis not present

## 2018-06-11 DIAGNOSIS — A6009 Herpesviral infection of other urogenital tract: Secondary | ICD-10-CM | POA: Diagnosis not present

## 2018-06-11 DIAGNOSIS — Z3A00 Weeks of gestation of pregnancy not specified: Secondary | ICD-10-CM | POA: Diagnosis not present

## 2018-06-11 DIAGNOSIS — Z23 Encounter for immunization: Secondary | ICD-10-CM | POA: Diagnosis not present

## 2018-06-11 DIAGNOSIS — O9832 Other infections with a predominantly sexual mode of transmission complicating childbirth: Secondary | ICD-10-CM | POA: Diagnosis not present

## 2018-06-11 DIAGNOSIS — Z3A39 39 weeks gestation of pregnancy: Secondary | ICD-10-CM | POA: Diagnosis not present

## 2018-06-11 DIAGNOSIS — Z331 Pregnant state, incidental: Secondary | ICD-10-CM | POA: Diagnosis not present

## 2018-06-12 DIAGNOSIS — Z3A00 Weeks of gestation of pregnancy not specified: Secondary | ICD-10-CM | POA: Diagnosis not present

## 2018-06-12 DIAGNOSIS — Z3A39 39 weeks gestation of pregnancy: Secondary | ICD-10-CM | POA: Diagnosis not present

## 2018-07-23 DIAGNOSIS — Z3046 Encounter for surveillance of implantable subdermal contraceptive: Secondary | ICD-10-CM | POA: Diagnosis not present

## 2018-09-11 ENCOUNTER — Encounter (HOSPITAL_COMMUNITY): Payer: Self-pay

## 2019-01-16 ENCOUNTER — Other Ambulatory Visit: Payer: Self-pay

## 2019-01-16 DIAGNOSIS — Z20822 Contact with and (suspected) exposure to covid-19: Secondary | ICD-10-CM

## 2019-01-17 LAB — NOVEL CORONAVIRUS, NAA: SARS-CoV-2, NAA: NOT DETECTED

## 2020-05-31 NOTE — Progress Notes (Signed)
   GYNECOLOGY CLINIC COLPOSCOPY PROCEDURE NOTE  30 y.o. [id number] here for colposcopy for ASCUS with POSITIVE high risk HPV  pap smear on 01/01/2017. Discussed underlying role for HPV infection in the development of cervical dysplasia, its natural history and progression/regression, need for surveillance.  Is the patient  pregnant: No LMP: No LMP recorded. (Menstrual status: IUD). Smoking status:  reports that she has never smoked. She has never used smokeless tobacco. Future fertility desired:  Yes  Patient given informed consent, signed copy in the chart, time out was performed.  The patient was position in dorsal lithotomy position. Speculum was placed the cervix was visualized.   After application of acetic acid colposcopic inspection of the cervix was undertaken.   Colposcopy adequate, full visualization of transformation zone: Yes acetowhite lesion(s) noted at 5 an 7 o'clock; corresponding biopsies obtained.   ECC specimen obtained:  Yes  All specimens were labeled and sent to pathology.   Patient was given post procedure instructions.  Will follow up pathology and manage accordingly.  Routine preventative health maintenance measures emphasized.  Physical Exam Genitourinary:        Adelene Idler MD [facility], [facility] 05/31/2020 3:13 PM
